# Patient Record
Sex: Male | Born: 1955 | Race: White | Hispanic: No | Marital: Married | Smoking: Current every day smoker
Health system: Southern US, Community
[De-identification: ages and names within clinical notes are randomized; demographics above are authoritative.]

## PROBLEM LIST (undated history)

## (undated) DIAGNOSIS — I10 Essential (primary) hypertension: Secondary | ICD-10-CM

## (undated) DIAGNOSIS — N529 Male erectile dysfunction, unspecified: Secondary | ICD-10-CM

## (undated) DIAGNOSIS — L209 Atopic dermatitis, unspecified: Secondary | ICD-10-CM

## (undated) DIAGNOSIS — S6292XA Unspecified fracture of left wrist and hand, initial encounter for closed fracture: Secondary | ICD-10-CM

## (undated) HISTORY — DX: Unspecified fracture of left hand, initial encounter for closed fracture: S62.92XA

## (undated) HISTORY — DX: Atopic dermatitis, unspecified: L20.9

## (undated) HISTORY — DX: Male erectile dysfunction, unspecified: N52.9

## (undated) HISTORY — DX: Essential (primary) hypertension: I10

---

## 2004-10-24 ENCOUNTER — Ambulatory Visit: Payer: Self-pay | Admitting: Family Medicine

## 2005-03-20 ENCOUNTER — Ambulatory Visit: Payer: Self-pay | Admitting: Family Medicine

## 2005-04-10 ENCOUNTER — Ambulatory Visit: Payer: Self-pay | Admitting: Family Medicine

## 2005-05-01 ENCOUNTER — Ambulatory Visit: Payer: Self-pay | Admitting: Family Medicine

## 2005-06-17 ENCOUNTER — Ambulatory Visit: Payer: Self-pay | Admitting: Family Medicine

## 2005-10-30 ENCOUNTER — Ambulatory Visit: Payer: Self-pay | Admitting: Family Medicine

## 2006-04-30 ENCOUNTER — Ambulatory Visit: Payer: Self-pay | Admitting: Family Medicine

## 2006-10-29 ENCOUNTER — Ambulatory Visit: Payer: Self-pay | Admitting: Family Medicine

## 2007-06-24 ENCOUNTER — Ambulatory Visit: Payer: Self-pay | Admitting: Family Medicine

## 2007-06-24 LAB — CONVERTED CEMR LAB
Glucose, Urine, Semiquant: NEGATIVE
Specific Gravity, Urine: 1.015
Urobilinogen, UA: 2

## 2007-06-27 LAB — CONVERTED CEMR LAB
ALT: 20 units/L (ref 0–53)
AST: 21 units/L (ref 0–37)
Albumin: 3.5 g/dL (ref 3.5–5.2)
BUN: 6 mg/dL (ref 6–23)
Basophils Relative: 0.7 % (ref 0.0–1.0)
Bilirubin, Direct: 0.1 mg/dL (ref 0.0–0.3)
CO2: 33 meq/L — ABNORMAL HIGH (ref 19–32)
Calcium: 9.1 mg/dL (ref 8.4–10.5)
Cholesterol: 138 mg/dL (ref 0–200)
Eosinophils Relative: 2.1 % (ref 0.0–5.0)
GFR calc Af Amer: 102 mL/min
Glucose, Bld: 98 mg/dL (ref 70–99)
HCT: 41.6 % (ref 39.0–52.0)
Hemoglobin: 14.4 g/dL (ref 13.0–17.0)
MCHC: 34.6 g/dL (ref 30.0–36.0)
MCV: 87.2 fL (ref 78.0–100.0)
Monocytes Absolute: 0.6 10*3/uL (ref 0.2–0.7)
Monocytes Relative: 9.6 % (ref 3.0–11.0)
Neutro Abs: 2.7 10*3/uL (ref 1.4–7.7)
Platelets: 230 10*3/uL (ref 150–400)
Potassium: 3.8 meq/L (ref 3.5–5.1)
TSH: 1.46 microintl units/mL (ref 0.35–5.50)
Total Bilirubin: 0.9 mg/dL (ref 0.3–1.2)
Total CHOL/HDL Ratio: 4.3
Total Protein: 5.9 g/dL — ABNORMAL LOW (ref 6.0–8.3)
VLDL: 10 mg/dL (ref 0–40)

## 2007-07-01 ENCOUNTER — Ambulatory Visit: Payer: Self-pay | Admitting: Family Medicine

## 2007-07-01 DIAGNOSIS — I10 Essential (primary) hypertension: Secondary | ICD-10-CM | POA: Insufficient documentation

## 2007-08-02 ENCOUNTER — Ambulatory Visit: Payer: Self-pay | Admitting: Family Medicine

## 2007-09-07 ENCOUNTER — Inpatient Hospital Stay (HOSPITAL_COMMUNITY): Admission: EM | Admit: 2007-09-07 | Discharge: 2007-09-08 | Payer: Self-pay | Admitting: Emergency Medicine

## 2007-09-07 ENCOUNTER — Encounter: Payer: Self-pay | Admitting: Family Medicine

## 2007-09-08 ENCOUNTER — Encounter: Payer: Self-pay | Admitting: Family Medicine

## 2007-09-12 ENCOUNTER — Ambulatory Visit: Payer: Self-pay | Admitting: Family Medicine

## 2007-10-03 ENCOUNTER — Ambulatory Visit: Payer: Self-pay | Admitting: Family Medicine

## 2007-10-04 LAB — CONVERTED CEMR LAB
Chloride: 98 meq/L (ref 96–112)
Sodium: 134 meq/L — ABNORMAL LOW (ref 135–145)

## 2007-12-30 ENCOUNTER — Ambulatory Visit: Payer: Self-pay | Admitting: Family Medicine

## 2008-01-27 ENCOUNTER — Ambulatory Visit: Payer: Self-pay | Admitting: Family Medicine

## 2008-06-29 ENCOUNTER — Ambulatory Visit: Payer: Self-pay | Admitting: Family Medicine

## 2009-01-04 ENCOUNTER — Ambulatory Visit: Payer: Self-pay | Admitting: Family Medicine

## 2009-01-04 LAB — CONVERTED CEMR LAB
Blood in Urine, dipstick: NEGATIVE
Nitrite: NEGATIVE
Urobilinogen, UA: 2
WBC Urine, dipstick: NEGATIVE
pH: 7.5

## 2009-01-08 LAB — CONVERTED CEMR LAB
AST: 15 units/L (ref 0–37)
Basophils Relative: 0.2 % (ref 0.0–3.0)
Creatinine, Ser: 0.9 mg/dL (ref 0.4–1.5)
Eosinophils Relative: 2.5 % (ref 0.0–5.0)
HCT: 42.5 % (ref 39.0–52.0)
LDL Cholesterol: 87 mg/dL (ref 0–99)
Lymphocytes Relative: 22.8 % (ref 12.0–46.0)
MCHC: 34.1 g/dL (ref 30.0–36.0)
MCV: 98 fL (ref 78.0–100.0)
Monocytes Relative: 6.5 % (ref 3.0–12.0)
Neutrophils Relative %: 68 % (ref 43.0–77.0)
PSA: 0.32 ng/mL (ref 0.10–4.00)
Platelets: 240 10*3/uL (ref 150–400)
Potassium: 4.5 meq/L (ref 3.5–5.1)
Sodium: 133 meq/L — ABNORMAL LOW (ref 135–145)
Total CHOL/HDL Ratio: 4
Total Protein: 6.1 g/dL (ref 6.0–8.3)
WBC: 6.4 10*3/uL (ref 4.5–10.5)

## 2009-01-11 ENCOUNTER — Ambulatory Visit: Payer: Self-pay | Admitting: Family Medicine

## 2009-01-21 ENCOUNTER — Ambulatory Visit: Payer: Self-pay | Admitting: Gastroenterology

## 2009-02-25 ENCOUNTER — Ambulatory Visit: Payer: Self-pay | Admitting: Gastroenterology

## 2009-02-25 ENCOUNTER — Encounter: Payer: Self-pay | Admitting: Gastroenterology

## 2009-02-25 LAB — HM COLONOSCOPY

## 2009-02-27 ENCOUNTER — Encounter: Payer: Self-pay | Admitting: Gastroenterology

## 2009-04-24 ENCOUNTER — Telehealth: Payer: Self-pay | Admitting: Family Medicine

## 2009-04-26 ENCOUNTER — Telehealth: Payer: Self-pay | Admitting: Family Medicine

## 2009-07-08 ENCOUNTER — Telehealth: Payer: Self-pay | Admitting: Family Medicine

## 2009-07-09 ENCOUNTER — Ambulatory Visit: Payer: Self-pay | Admitting: Family Medicine

## 2009-07-12 ENCOUNTER — Ambulatory Visit: Payer: Self-pay | Admitting: Family Medicine

## 2010-01-03 ENCOUNTER — Ambulatory Visit: Payer: Self-pay | Admitting: Family Medicine

## 2010-01-03 DIAGNOSIS — N138 Other obstructive and reflux uropathy: Secondary | ICD-10-CM | POA: Insufficient documentation

## 2010-01-03 DIAGNOSIS — N401 Enlarged prostate with lower urinary tract symptoms: Secondary | ICD-10-CM | POA: Insufficient documentation

## 2010-01-07 LAB — CONVERTED CEMR LAB
AST: 22 units/L (ref 0–37)
BUN: 5 mg/dL — ABNORMAL LOW (ref 6–23)
Basophils Absolute: 0 10*3/uL (ref 0.0–0.1)
CO2: 30 meq/L (ref 19–32)
Chloride: 101 meq/L (ref 96–112)
Eosinophils Absolute: 0.3 10*3/uL (ref 0.0–0.7)
Glucose, Bld: 58 mg/dL — ABNORMAL LOW (ref 70–99)
HCT: 41.8 % (ref 39.0–52.0)
LDL Cholesterol: 88 mg/dL (ref 0–99)
Lymphocytes Relative: 30 % (ref 12.0–46.0)
Lymphs Abs: 1.7 10*3/uL (ref 0.7–4.0)
MCHC: 33.6 g/dL (ref 30.0–36.0)
MCV: 100.7 fL — ABNORMAL HIGH (ref 78.0–100.0)
Neutro Abs: 3.3 10*3/uL (ref 1.4–7.7)
Neutrophils Relative %: 57.3 % (ref 43.0–77.0)
Potassium: 5.8 meq/L — ABNORMAL HIGH (ref 3.5–5.1)
Sodium: 135 meq/L (ref 135–145)
TSH: 0.87 microintl units/mL (ref 0.35–5.50)
Triglycerides: 47 mg/dL (ref 0.0–149.0)
WBC: 5.8 10*3/uL (ref 4.5–10.5)

## 2010-01-09 ENCOUNTER — Ambulatory Visit: Payer: Self-pay | Admitting: Family Medicine

## 2010-01-10 LAB — CONVERTED CEMR LAB
Chloride: 101 meq/L (ref 96–112)
GFR calc non Af Amer: 94.01 mL/min (ref >60)
Glucose, Bld: 85 mg/dL (ref 70–99)
Potassium: 3.5 meq/L (ref 3.5–5.1)
Sodium: 134 meq/L — ABNORMAL LOW (ref 135–145)

## 2010-06-30 ENCOUNTER — Ambulatory Visit: Payer: Self-pay | Admitting: Family Medicine

## 2010-07-11 ENCOUNTER — Ambulatory Visit: Payer: Self-pay | Admitting: Family Medicine

## 2010-12-20 ENCOUNTER — Encounter: Payer: Self-pay | Admitting: Family Medicine

## 2010-12-21 LAB — CONVERTED CEMR LAB
BUN: 9 mg/dL (ref 6–23)
Chloride: 95 meq/L — ABNORMAL LOW (ref 96–112)
Creatinine, Ser: 0.9 mg/dL (ref 0.4–1.5)
Glucose, Bld: 94 mg/dL (ref 70–99)
Potassium: 5 meq/L (ref 3.5–5.1)
Sodium: 133 meq/L — ABNORMAL LOW (ref 135–145)

## 2010-12-23 NOTE — Medication Information (Signed)
Summary: Authorization for Viagra  Authorization for Viagra   Imported By: Maryln Gottron 01/08/2010 15:40:00  _____________________________________________________________________  External Attachment:    Type:   Image     Comment:   External Document

## 2010-12-23 NOTE — Assessment & Plan Note (Signed)
Summary: 6 MONTH ROV//[facility]   Vital Signs:  Patient profile:   55 year old male Weight:      153 pounds BMI:     23.69 O2 Sat:      93 % on Room air Temp:     98 degrees F Pulse rate:   64 / minute BP sitting:   140 / 90  (left arm) Cuff size:   large  Vitals Entered By: Pura Spice, RN (January 03, 2010 8:37 AM)  O2 Flow:  Room air CC: 6 month follow up for bp. smokes ppd. Is Patient Diabetic? No   History of Present Illness: here to recheck BP. As usual it is stable at home but jumps up when he comes here. He feels fine.   Preventive Screening-Counseling & Management  Alcohol-Tobacco     Smoking Status: current     Packs/Day: 1.0     Year Started: 1974  Allergies: 1)  ! Erythromycin  Past History:  Past Medical History: Reviewed history from 01/11/2009 and no changes required. Hypertension ED concussion 10-08  Social History: Packs/Day:  1.0  Review of Systems  The patient denies anorexia, fever, weight loss, weight gain, vision loss, decreased hearing, hoarseness, chest pain, syncope, dyspnea on exertion, peripheral edema, prolonged cough, headaches, hemoptysis, abdominal pain, melena, hematochezia, severe indigestion/heartburn, hematuria, incontinence, genital sores, muscle weakness, suspicious skin lesions, transient blindness, difficulty walking, depression, unusual weight change, abnormal bleeding, enlarged lymph nodes, angioedema, breast masses, and testicular masses.    Physical Exam  General:  Well-developed,well-nourished,in no acute distress; alert,appropriate and cooperative throughout examination Neck:  No deformities, masses, or tenderness noted. Lungs:  Normal respiratory effort, chest expands symmetrically. Lungs are clear to auscultation, no crackles or wheezes. Heart:  Normal rate and regular rhythm. S1 and S2 normal without gallop, murmur, click, rub or other extra sounds.   Impression & Recommendations:  Problem # 1:  HYPERTENSION  (ICD-401.9)  His updated medication list for this problem includes:    Toprol Xl 200 Mg Tb24 (Metoprolol succinate) .Marland Kitchen... 1 by mouth once daily    Norvasc 10 Mg Tabs (Amlodipine besylate) .Marland Kitchen... 1 by mouth once daily    Cozaar 100 Mg Tabs (Losartan potassium) .Marland Kitchen... 1 by mouth once daily    Clonidine Hcl 0.2 Mg Tabs (Clonidine hcl) .Marland Kitchen..Marland Kitchen Two times a day  Orders: UA Dipstick w/o Micro (automated)  (81003) Venipuncture (47425) TLB-Lipid Panel (80061-LIPID) TLB-BMP (Basic Metabolic Panel-BMET) (80048-METABOL) TLB-CBC Platelet - w/Differential (85025-CBCD) TLB-Hepatic/Liver Function Pnl (80076-HEPATIC) TLB-TSH (Thyroid Stimulating Hormone) (84443-TSH)  Problem # 2:  BENIGN PROSTATIC HYPERTROPHY, WITH OBSTRUCTION (ICD-600.01)  Orders: TLB-PSA (Prostate Specific Antigen) (84153-PSA)  Complete Medication List: 1)  Toprol Xl 200 Mg Tb24 (Metoprolol succinate) .Marland Kitchen.. 1 by mouth once daily 2)  Norvasc 10 Mg Tabs (Amlodipine besylate) .Marland Kitchen.. 1 by mouth once daily 3)  Viagra 100 Mg Tabs (Sildenafil citrate) .... As needed 4)  Cozaar 100 Mg Tabs (Losartan potassium) .Marland Kitchen.. 1 by mouth once daily 5)  Clonidine Hcl 0.2 Mg Tabs (Clonidine hcl) .... Two times a day  Patient Instructions: 1)  Get labs today Prescriptions: CLONIDINE HCL 0.2 MG TABS (CLONIDINE HCL) two times a day  #60 x 11   Entered and Authorized by:   Nelwyn Salisbury MD   Signed by:   Nelwyn Salisbury MD on 01/03/2010   Method used:   Electronically to        [facility] (retail)       [address]  [address] [place], [place]  [address]       Ph: [phone]       Fax: [phone]   RxID:   [id number] COZAAR 100 MG TABS (LOSARTAN POTASSIUM) 1 by mouth once daily  #30 x 11   Entered and Authorized by:   Nelwyn Salisbury MD   Signed by:   Nelwyn Salisbury MD on 01/03/2010   Method used:   Electronically to        [facility] (retail)       [address]       [place], [place]  [address]       Ph: [phone]       Fax: [phone]   RxID:   [id number] VIAGRA 100 MG TABS (SILDENAFIL CITRATE) as needed  #10 x 11   Entered and Authorized by:   Nelwyn Salisbury MD   Signed by:   Nelwyn Salisbury MD on 01/03/2010   Method used:   Electronically to        [facility] (retail)       [address]       [place], [place]  [address]       Ph: [phone]       Fax: [phone]   RxID:   [id number] NORVASC 10 MG TABS (AMLODIPINE BESYLATE) 1 by mouth once daily  #30 x 11   Entered and Authorized by:   Nelwyn Salisbury MD   Signed by:   Nelwyn Salisbury MD on 01/03/2010   Method used:   Electronically to        [facility] (retail)       [address]       [place], [place]  [address]       Ph: [phone]       Fax: [phone]   RxID:   [id number] TOPROL XL 200 MG TB24 (METOPROLOL SUCCINATE) 1 by mouth once daily  #30 x 11   Entered and Authorized by:   Nelwyn Salisbury MD   Signed by:   Nelwyn Salisbury MD on 01/03/2010   Method used:   Electronically to        [facility] (retail)       [address]       [place], [place]  [address]       Ph: [phone]       Fax: [phone]   RxID:   [id number]   Appended Document: 6 MONTH ROV//[facility]  Laboratory Results   Urine Tests    Routine Urinalysis   Color: yellow Appearance: Clear Glucose: negative   (Normal Range: Negative) Bilirubin: negative   (Normal Range: Negative) Ketone: negative   (Normal Range: Negative) Spec. Gravity: 1.015   (Normal Range: 1.003-1.035) Blood: negative   (Normal Range: Negative) pH: 7.0   (Normal Range: 5.0-8.0) Protein: negative   (Normal Range: Negative) Urobilinogen: 0.2   (Normal Range: 0-1) Nitrite: negative   (Normal Range: Negative) Leukocyte Esterace: negative   (Normal Range: Negative)    Comments: Rita Ohara  January 03, 2010 5:01  PM

## 2010-12-23 NOTE — Medication Information (Signed)
Summary: Authorization for Viagra/[facility]  Authorization for Viagra/[facility]   Imported By: Maryln Gottron 01/07/2010 15:56:54  _____________________________________________________________________  External Attachment:    Type:   Image     Comment:   External Document

## 2010-12-23 NOTE — Assessment & Plan Note (Signed)
Summary: HAND PAIN // [facility]   Vital Signs:  Patient profile:   55 year old male Weight:      151 pounds BMI:     23.39 BP sitting:   140 / 86  (right arm) Cuff size:   regular  Vitals Entered By: Raechel Ache, RN (June 30, 2010 9:32 AM) CC: Slammed L hand in door Friday- swollen, painful, bruised.   History of Present Illness: On 06-27-10 while working on his job he was pulling down a sliding door on the back of his truck, when his left hand was caught under the door. he immediately had a lot of pain and swelling, so he went home. He has been icing the hand and taking Advil since then. The hand still throbs and he is unable to drive or load today.   Allergies: 1)  ! Erythromycin  Past History:  Past Medical History: Reviewed history from 01/11/2009 and no changes required. Hypertension ED concussion 10-08  Past Surgical History: Reviewed history from 07/09/2009 and no changes required. Denies surgical history colonoscopy 02-25-09 per Dr. Jarold Motto, showed multiple polyps and severe diverticulosis, repeat in 5 yrs  Review of Systems  The patient denies anorexia, fever, weight loss, weight gain, vision loss, decreased hearing, hoarseness, chest pain, syncope, dyspnea on exertion, peripheral edema, prolonged cough, headaches, hemoptysis, abdominal pain, melena, hematochezia, severe indigestion/heartburn, hematuria, incontinence, genital sores, muscle weakness, suspicious skin lesions, transient blindness, difficulty walking, depression, unusual weight change, abnormal bleeding, enlarged lymph nodes, angioedema, breast masses, and testicular masses.    Physical Exam  General:  Well-developed,well-nourished,in no acute distress; alert,appropriate and cooperative throughout examination Extremities:  the left hand is ecchymotic and swollen. He is tender over the 2nd and 3rd metacarpals. No crepitus. His fingers have full EOM and are intact neurovascularly   Impression &  Recommendations:  Problem # 1:  CONTUSION OF HAND (ICD-923.20)  Complete Medication List: 1)  Toprol Xl 200 Mg Tb24 (Metoprolol succinate) .Marland Kitchen.. 1 by mouth once daily 2)  Norvasc 10 Mg Tabs (Amlodipine besylate) .Marland Kitchen.. 1 by mouth once daily 3)  Viagra 100 Mg Tabs (Sildenafil citrate) .... As needed 4)  Cozaar 100 Mg Tabs (Losartan potassium) .Marland Kitchen.. 1 by mouth once daily 5)  Clonidine Hcl 0.2 Mg Tabs (Clonidine hcl) .... Two times a day  Other Orders: T-Hand Left 3 Views (73130TC)  Patient Instructions: 1)  We will send him for Xrays of the hand now

## 2010-12-23 NOTE — Assessment & Plan Note (Signed)
Summary: cpx/pt will come in fasting/njr   Vital Signs:  Patient profile:   55 year old male Weight:      149 pounds BMI:     23.08 BP sitting:   150 / 98  (left arm) Cuff size:   regular  Vitals Entered By: Raechel Ache, RN (July 11, 2010 9:18 AM) CC: CPX, fasting but had labs done in Feb.    History of Present Illness: 55 yr old male for a cpx. He feels fine other than the left hand. He fractured 3 metacarpals in the left hand 2 weeks ago. Saw Dr. Gilman Schmidt and had a cast applied. His BP is steady at home in the range of 120-130 over 70-80.   Allergies: 1)  ! Erythromycin  Past History:  Past Medical History: Hypertension ED concussion 10-08 right hand fractures 06-2010  Past Surgical History: Reviewed history from 07/09/2009 and no changes required. Denies surgical history colonoscopy 02-25-09 per Dr. Jarold Motto, showed multiple polyps and severe diverticulosis, repeat in 5 yrs  Family History: Reviewed history from 07/01/2007 and no changes required. Family History of Arthritis Family History of CAD Male 1st degree relative <60 Family History Hypertension  Social History: Reviewed history from 01/11/2009 and no changes required. Occupation: truck Hospital doctor Married Current Smoker Alcohol use-no  Review of Systems  The patient denies anorexia, fever, weight loss, weight gain, vision loss, decreased hearing, hoarseness, chest pain, syncope, dyspnea on exertion, peripheral edema, prolonged cough, headaches, hemoptysis, abdominal pain, melena, hematochezia, severe indigestion/heartburn, hematuria, incontinence, genital sores, muscle weakness, suspicious skin lesions, transient blindness, difficulty walking, depression, unusual weight change, abnormal bleeding, enlarged lymph nodes, angioedema, breast masses, and testicular masses.    Physical Exam  General:  Well-developed,well-nourished,in no acute distress; alert,appropriate and cooperative throughout  examination Head:  Normocephalic and atraumatic without obvious abnormalities. No apparent alopecia or balding. Eyes:  No corneal or conjunctival inflammation noted. EOMI. Perrla. Funduscopic exam benign, without hemorrhages, exudates or papilledema. Vision grossly normal. Ears:  External ear exam shows no significant lesions or deformities.  Otoscopic examination reveals clear canals, tympanic membranes are intact bilaterally without bulging, retraction, inflammation or discharge. Hearing is grossly normal bilaterally. Nose:  External nasal examination shows no deformity or inflammation. Nasal mucosa are pink and moist without lesions or exudates. Mouth:  Oral mucosa and oropharynx without lesions or exudates.  Teeth in good repair. Neck:  No deformities, masses, or tenderness noted. Chest Wall:  No deformities, masses, tenderness or gynecomastia noted. Lungs:  Normal respiratory effort, chest expands symmetrically. Lungs are clear to auscultation, no crackles or wheezes. Heart:  Normal rate and regular rhythm. S1 and S2 normal without gallop, murmur, click, rub or other extra sounds. EKG normal Abdomen:  Bowel sounds positive,abdomen soft and non-tender without masses, organomegaly or hernias noted. Rectal:  No external abnormalities noted. Normal sphincter tone. No rectal masses or tenderness. Heme neg Genitalia:  Testes bilaterally descended without nodularity, tenderness or masses. No scrotal masses or lesions. No penis lesions or urethral discharge. Prostate:  Prostate gland firm and smooth, no enlargement, nodularity, tenderness, mass, asymmetry or induration. Msk:  No deformity or scoliosis noted of thoracic or lumbar spine.   Pulses:  R and L carotid,radial,femoral,dorsalis pedis and posterior tibial pulses are full and equal bilaterally Extremities:  normal except for the left hand Neurologic:  No cranial nerve deficits noted. Station and gait are normal. Plantar reflexes are down-going  bilaterally. DTRs are symmetrical throughout. Sensory, motor and coordinative functions appear intact. Skin:  Intact without suspicious lesions or rashes Cervical Nodes:  No lymphadenopathy noted Axillary Nodes:  No palpable lymphadenopathy Inguinal Nodes:  No significant adenopathy Psych:  Cognition and judgment appear intact. Alert and cooperative with normal attention span and concentration. No apparent delusions, illusions, hallucinations   Impression & Recommendations:  Problem # 1:  PREVENTIVE HEALTH CARE (ICD-V70.0)  Orders: Venipuncture (13086) TLB-BMP (Basic Metabolic Panel-BMET) (80048-METABOL) Hemoccult Guaiac-1 spec.(in office) (82270) EKG w/ Interpretation (93000)  Complete Medication List: 1)  Toprol Xl 200 Mg Tb24 (Metoprolol succinate) .Marland Kitchen.. 1 by mouth once daily 2)  Norvasc 10 Mg Tabs (Amlodipine besylate) .Marland Kitchen.. 1 by mouth once daily 3)  Viagra 100 Mg Tabs (Sildenafil citrate) .... As needed 4)  Cozaar 100 Mg Tabs (Losartan potassium) .Marland Kitchen.. 1 by mouth once daily 5)  Clonidine Hcl 0.2 Mg Tabs (Clonidine hcl) .... Two times a day  Patient Instructions: 1)  get labs  Appended Document: Orders Update    Clinical Lists Changes  Orders: Added new Service order of Specimen Handling (57846) - Signed

## 2011-01-09 ENCOUNTER — Ambulatory Visit (INDEPENDENT_AMBULATORY_CARE_PROVIDER_SITE_OTHER): Payer: Managed Care, Other (non HMO) | Admitting: Family Medicine

## 2011-01-09 ENCOUNTER — Encounter: Payer: Self-pay | Admitting: Family Medicine

## 2011-01-09 VITALS — BP 140/80 | HR 57 | Temp 98.1°F | Wt 154.0 lb

## 2011-01-09 DIAGNOSIS — I10 Essential (primary) hypertension: Secondary | ICD-10-CM

## 2011-01-09 MED ORDER — LOSARTAN POTASSIUM 100 MG PO TABS: 100.0000 mg | ORAL_TABLET | Freq: Every day | ORAL | Status: DC

## 2011-01-09 MED ORDER — CLONIDINE HCL 0.2 MG PO TABS: 0.2000 mg | ORAL_TABLET | Freq: Two times a day (BID) | ORAL | Status: DC

## 2011-01-09 MED ORDER — AMLODIPINE BESYLATE 10 MG PO TABS: 10.0000 mg | ORAL_TABLET | Freq: Every day | ORAL | Status: DC

## 2011-01-09 MED ORDER — METOPROLOL SUCCINATE ER 200 MG PO TB24: 200.0000 mg | ORAL_TABLET | Freq: Every day | ORAL | Status: DC

## 2011-01-09 NOTE — Progress Notes (Signed)
  Subjective:    Patient ID: [name], male    DOB: 1956/06/04, 55 y.o.   MRN: [id number]  HPI Here to follow up on HTN. He feels great, and his BP at home is stable.    Review of Systems  Constitutional: Negative.   Respiratory: Negative.   Cardiovascular: Negative.        Objective:   Physical Exam  Constitutional: He appears well-developed and well-nourished.  Cardiovascular: Normal rate, regular rhythm, normal heart sounds and intact distal pulses.  Exam reveals no gallop and no friction rub.   No murmur heard. Pulmonary/Chest: Effort normal and breath sounds normal. No respiratory distress. He has no wheezes. He has no rales. He exhibits no tenderness.          Assessment & Plan:  Doing well. Get his cpx in August

## 2011-04-07 NOTE — H&P (Signed)
[name], [name]            ACCOUNT NO.:  [id number]   MEDICAL RECORD NO.:  [id number]          PATIENT TYPE:  INP   LOCATION:  2928                         FACILITY:  [facility]   PHYSICIAN:  Gabrielle Dare. Janee Morn, M.D.DATE OF BIRTH:  04-29-1956   DATE OF ADMISSION:  09/06/2007  DATE OF DISCHARGE:                              HISTORY & PHYSICAL   CHIEF COMPLAINT:  Head pain and slurred speech after fall.   HISTORY OF PRESENT ILLNESS:  The patient is a 55 year old white male who  fell from the back of his semi-trailer truck.  He had no loss of  consciousness at the scene.  He had some mild headache.  He was  evaluated at the [facility] emergency department.  Work up here showed  likely intracerebral contusions and the patient does have some slurred  speech and we are asked to admit to the trauma service.   PAST MEDICAL HISTORY:  Hypertension.   PAST SURGICAL HISTORY:  None.   SOCIAL HISTORY:  He smokes cigarettes.  He occasionally drinks alcohol.  He works as a Naval architect.   ALLERGIES:  ERYTHROMYCIN.   MEDICATIONS:  1. Diovan/HCTZ 320/12.5 mg p.o. daily.  2. Norvasc 10 mg p.o. daily.  3. Toprol XL 200 mg p.o. daily.   REVIEW OF SYSTEMS:  Significant for the above complaints.  In addition  on the musculoskeletal system he has an abrasion on his left shoulder.   PHYSICAL EXAMINATION:  VITAL SIGNS:  Temperature  97.7, pulse 62,  respirations 16, blood pressure 117/79, saturation 98%.  HEENT:  There is a small hematoma on his left scalp.  Eyes:  Pupils are  equal and reactive.  Sclerae are clear.  Ears are clear bilaterally.  Face is atraumatic.  NECK:  Supple.  No tenderness posteriorly and no step offs.  PULMONARY:  Lungs are clear to auscultation.  CARDIOVASCULAR:  Heart is regular.  No murmurs are heard.  Distal pulses  are 2+.  ABDOMEN:  Soft and nontender.  Bowel sounds are normal.  No organomegaly  is noted.  Pelvis is stable anteriorly.  EXTREMITIES:  He does have  some abrasions on this left posterior  shoulder.  MUSCULOSKELETAL:  He has no gross deformities and no tenderness.  BACK:  There is no midline step off or tenderness.  He does have the  abrasions as noted above.  NEUROLOGIC:  Glasgow Coma Scale is 14. Eyes 4, verbal 4, motor 6.  He  does have some slurred speech but he does move all extremities and  follows commands.  Strength is equal.   LABORATORY STUDIES:  Pending.  CT scan of the head shows questionable  right frontal and temporal intracerebral contusions.  CT scan of the  cervical spine shows degenerative changes but no acute findings.   IMPRESSION:  A 55 year old white male status post fall.  1. 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  2. Left shoulder abrasion.  3. Hypertension.   PLAN:  Admit him to the trauma service and the step-down unit.  We will  get  a followup head CT in the morning and have a nonurgent neurosurgery  consultation.      Gabrielle Dare Janee Morn, M.D.  Electronically Signed     BET/MEDQ  D:  09/06/2007  T:  09/07/2007  Job:  [id number]

## 2011-04-07 NOTE — Discharge Summary (Signed)
NAME:  [name], [name]            ACCOUNT NO.:  [id number]   MEDICAL RECORD NO.:  [id number]          PATIENT TYPE:  INP   LOCATION:  3041                         FACILITY:  [facility]   PHYSICIAN:  Adam Dare. Janee [name], Adam ToddDATE OF BIRTH:  08/27/1956   DATE OF ADMISSION:  09/06/2007  DATE OF DISCHARGE:  09/08/2007                               DISCHARGE SUMMARY   DISCHARGE DIAGNOSES:  1. Fall.  2. Traumatic brain injury with intracerebral contusion.  3. Hyponatremia.  4. Hypertension.  5. Abrasions over the left scalp and shoulder area.   CONSULTANTS:  Dr. Yetta Barre from neurosurgery.   PROCEDURE:  None.   HISTORY OF PRESENT ILLNESS:  This is a 55 year old white male who fell  off the back of a tractor trailer truck striking his left shoulder and  head.  He came in for evaluation as a non-trauma code after having some  slurred speech and a mild headache.  He had 2 small intracerebral  contusions and was admitted for observation.  Interestingly enough on  presentation, he was found to have  significant hyponatremia with a  level at 125.   HOSPITAL COURSE:  The patient did well in the hospital. The slurred  speech cleared up within 12 hours.  He was essentially asymptomatic from  both the head injury and hyponatremic standpoint during his hospital  stay.  Follow up CT scan did not show any worsening of the brain injury.  His sodium climbed to around 127-128 for his final 2 days in the  hospital.  He remained asymptomatic and was able be discharged home in  good condition in the care of his wife.   DISCHARGE MEDICATIONS:  1. Toprol.  2. Norvasc to use as directed.  3. Diovan/hydrochlorothiazide.  He is to hold this as it is      potentially the source of his hyponatremia until he discusses it      with his primary care physician.  He notes he is having no pain, so      did not need to go home with a prescription for any pain medicine.   FOLLOW UP:  1. The patient will follow up with  Dr. Yetta Barre in his office.  2. His primary care [name], [name] for evaluation of his      hyponatremia.   DISCHARGE INSTRUCTIONS:  1. If he has questions or concerns, he may call the trauma service.  2. Otherwise, follow up with [place] will be on as needed basis.      [name], P.A.      [name], M.D.  Electronically Signed    MJ/MEDQ  D:  09/08/2007  T:  09/09/2007  Job:  [id number]   cc:   Jeannett Senior A. Clent Ridges, MD  Tia Alert, MD

## 2011-04-07 NOTE — Consult Note (Signed)
NAMEQUINTEN, [name]            ACCOUNT NO.:  [id number]   MEDICAL RECORD NO.:  [id number]          PATIENT TYPE:  INP   LOCATION:  2928                         FACILITY:  [facility]   PHYSICIAN:  Tia Alert, MD     DATE OF BIRTH:  [birth year]   DATE OF CONSULTATION:  09/07/2007  DATE OF DISCHARGE:                                 CONSULTATION   CHIEF COMPLAINT:  Closed head injury.   HISTORY OF PRESENT ILLNESS:  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  He landed on his back.  He denies any neck pain.  He  denies and loss of consciousness.  He did have some mild headache.  He  had an immediate onset of slurred speech.  He presented to the emergency  department where a head CT showed two small hyperdense foci, one in the  right frontal lobe, one in the right temporal lobe.  Question was raised  rather this was contusion or not.  He was admitted to a stepdown unit  for observation with plans to have followup head CT today.  At this  point, he only complains of very mild headache, had a significant  clearing of his speech, though it has not completely cleared he  believes.  He denies any numbness, tingling or weakness, or any seizure  activity.  He has had no nausea and vomiting, no double vision and no  neck pain.   PAST MEDICAL HISTORY:  Hypertension.   MEDICATIONS:  Diovan, Norvasc and Toprol XL.   ALLERGIES:  ERYTHROMYCIN.   SOCIAL HISTORY:  He works as a Naval [employer], uses occasional social  alcohol and he is a smoker.   PHYSICAL EXAMINATION:  VITAL SIGNS:  He is afebrile, pulse of 65,  respirations 16, blood pressure 118/80.  GENERAL:  Very pleasant cooperative male, lying in a stretcher.  HEENT:  He has a small contusion to the left occipital region.  His  extraocular movements are intact.  Pupils are equal,  round and reactive.  Oropharynx is benign.  NECK:  Supple and nontender.  HEART:  Regular rate and rhythm.  EXTREMITIES:  No obvious deformities.  NEUROLOGIC EXAM:  He is awake and alert.  He is oriented x4.  He has no  aphasia.  He has good attention span.  Fund of knowledge and memory  appear to be appropriate.  There is some mild slurring of his speech,  but this is the first time I have ever heard him speak so it is  difficult for me to compare this to his usual voice.  He has no facial  asymmetry.  His tongue protrudes to midline.  He can puff out his  cheeks.  He has no pronator drift.  He seems to move all extremities  well and has grossly intact sensation.  His gait is not tested.   IMAGING STUDIES:  CT scan of the brain I have reviewed  as well as  report.  It shows two tiny foci of hyperdensity in the right frontal  white matter and the right temporal white matter without significant  mass effect, edema shift.  The basal cisterns are open.  There is no  subdural or epidural hematoma or significant traumatic subarachnoid  hemorrhage.  I see no skull fracture.   CT scan of the cervical spine shows no fracture or subluxation.   ASSESSMENT/PLAN:  This is a 55 year old gentleman with a mild closed  heady injury from a fall.  Head CT shows two small hyperdense foci, one  in the right frontal lobe, one in the right temporal lobe.  It is  difficult to know if this is contusion or artifact.  He is scheduled for  a repeat CT scan today to compare and I think this is an excellent idea.  I do not know if these are going to be clinically significant at all.  Either way, I have spoken to him about his concussion and the avoidance  of any second impact to avoid the second impact syndrome.  I have  explained to him the typical symptoms of a postconcussive syndrome, and  he knows he is to avoid all strenuous activities until those symptoms  abate.  There is no need for any neurosurgical  followup at this point  unless the second head CT shows some type of significant change over the  first head CT.      Tia Alert, MD  Electronically Signed     DSJ/MEDQ  D:  09/07/2007  T:  09/07/2007  Job:  [id number]

## 2011-07-17 ENCOUNTER — Other Ambulatory Visit: Payer: Managed Care, Other (non HMO) | Admitting: Family Medicine

## 2011-07-24 ENCOUNTER — Encounter: Payer: Managed Care, Other (non HMO) | Admitting: Family Medicine

## 2011-08-07 ENCOUNTER — Other Ambulatory Visit (INDEPENDENT_AMBULATORY_CARE_PROVIDER_SITE_OTHER): Payer: Managed Care, Other (non HMO)

## 2011-08-07 DIAGNOSIS — Z0000 Encounter for general adult medical examination without abnormal findings: Secondary | ICD-10-CM

## 2011-08-07 LAB — BASIC METABOLIC PANEL
BUN: 11 mg/dL (ref 6–23)
Calcium: 8.9 mg/dL (ref 8.4–10.5)
Chloride: 96 mEq/L (ref 96–112)
Glucose, Bld: 94 mg/dL (ref 70–99)
Potassium: 5.3 mEq/L — ABNORMAL HIGH (ref 3.5–5.1)
Sodium: 133 mEq/L — ABNORMAL LOW (ref 135–145)

## 2011-08-07 LAB — HEPATIC FUNCTION PANEL
AST: 22 U/L (ref 0–37)
Albumin: 3.6 g/dL (ref 3.5–5.2)

## 2011-08-07 LAB — POCT URINALYSIS DIPSTICK
Glucose, UA: NEGATIVE
Leukocytes, UA: NEGATIVE
Urobilinogen, UA: 0.2

## 2011-08-07 LAB — CBC WITH DIFFERENTIAL/PLATELET
Basophils Absolute: 0 10*3/uL (ref 0.0–0.1)
Basophils Relative: 0.4 % (ref 0.0–3.0)
Eosinophils Absolute: 0.2 10*3/uL (ref 0.0–0.7)
Eosinophils Relative: 2.7 % (ref 0.0–5.0)
Lymphocytes Relative: 19.7 % (ref 12.0–46.0)
MCV: 101.2 fl — ABNORMAL HIGH (ref 78.0–100.0)
Neutro Abs: 5.8 10*3/uL (ref 1.4–7.7)
Neutrophils Relative %: 71 % (ref 43.0–77.0)
Platelets: 261 10*3/uL (ref 150.0–400.0)

## 2011-08-07 LAB — LIPID PANEL
Cholesterol: 147 mg/dL (ref 0–200)
HDL: 40.6 mg/dL (ref >39.00)
LDL Cholesterol: 97 mg/dL (ref 0–99)
Triglycerides: 47 mg/dL (ref 0.0–149.0)

## 2011-08-12 NOTE — Progress Notes (Signed)
Quick Note:  Left a message for pt to return call about lab results. ______

## 2011-08-12 NOTE — Progress Notes (Signed)
Quick Note:  Pt aware ______ 

## 2011-08-14 ENCOUNTER — Encounter: Payer: Self-pay | Admitting: Family Medicine

## 2011-08-14 ENCOUNTER — Ambulatory Visit (INDEPENDENT_AMBULATORY_CARE_PROVIDER_SITE_OTHER): Payer: Managed Care, Other (non HMO) | Admitting: Family Medicine

## 2011-08-14 VITALS — BP 130/84 | HR 58 | Temp 97.8°F | Ht 67.5 in | Wt 150.0 lb

## 2011-08-14 DIAGNOSIS — Z0000 Encounter for general adult medical examination without abnormal findings: Secondary | ICD-10-CM

## 2011-08-14 NOTE — Progress Notes (Signed)
  Subjective:    Patient ID: [name], male    DOB: [birth year], 55 y.o.   MRN: [id number]  HPI 55 yr old male for a cpx. He feels good and has no concerns.   Review of Systems  Constitutional: Negative.   HENT: Negative.   Eyes: Negative.   Respiratory: Negative.   Cardiovascular: Negative.   Gastrointestinal: Negative.   Genitourinary: Negative.   Musculoskeletal: Negative.   Skin: Negative.   Neurological: Negative.   Hematological: Negative.   Psychiatric/Behavioral: Negative.        Objective:   Physical Exam  Constitutional: He is oriented to person, place, and time. He appears well-developed and well-nourished. No distress.  HENT:  Head: Normocephalic and atraumatic.  Right Ear: External ear normal.  Left Ear: External ear normal.  Nose: Nose normal.  Mouth/Throat: Oropharynx is clear and moist. No oropharyngeal exudate.  Eyes: Conjunctivae and EOM are normal. Pupils are equal, round, and reactive to light. Right eye exhibits no discharge. Left eye exhibits no discharge. No scleral icterus.  Neck: Neck supple. No JVD present. No tracheal deviation present. No thyromegaly present.  Cardiovascular: Normal rate, regular rhythm, normal heart sounds and intact distal pulses.  Exam reveals no gallop and no friction rub.   No murmur heard.      EKG normal  Pulmonary/Chest: Effort normal and breath sounds normal. No respiratory distress. He has no wheezes. He has no rales. He exhibits no tenderness.  Abdominal: Soft. Bowel sounds are normal. He exhibits no distension and no mass. There is no tenderness. There is no rebound and no guarding.  Genitourinary: Rectum normal, prostate normal and penis normal. Guaiac negative stool. No penile tenderness.  Musculoskeletal: Normal range of motion. He exhibits no edema and no tenderness.  Lymphadenopathy:    He has no cervical adenopathy.  Neurological: He is alert and oriented to person, place, and time. He has normal  reflexes. No cranial nerve deficit. He exhibits normal muscle tone. Coordination normal.  Skin: Skin is warm and dry. No rash noted. He is not diaphoretic. No erythema. No pallor.  Psychiatric: He has a normal mood and affect. His behavior is normal. Judgment and thought content normal.          Assessment & Plan:  Get more exercise. Recheck in 6 months

## 2011-09-02 LAB — BASIC METABOLIC PANEL
BUN: 9
Chloride: 88 — ABNORMAL LOW
Chloride: 93 — ABNORMAL LOW
Creatinine, Ser: 1.04
GFR calc Af Amer: >60
GFR calc non Af Amer: >60
GFR calc non Af Amer: >60
Glucose, Bld: 101 — ABNORMAL HIGH
Potassium: 4.4
Sodium: 127 — ABNORMAL LOW
Sodium: 128 — ABNORMAL LOW

## 2011-09-02 LAB — CBC
HCT: 38.3 — ABNORMAL LOW
MCV: 98.5
Platelets: 285
Platelets: 314
RDW: 14.6 — ABNORMAL HIGH
RDW: 15.3 — ABNORMAL HIGH

## 2011-09-03 LAB — CBC
Hemoglobin: 14.2
MCV: 99.3
RBC: 4.13 — ABNORMAL LOW
WBC: 7.8

## 2011-09-03 LAB — DIFFERENTIAL
Basophils Absolute: 0
Eosinophils Relative: 0
Lymphocytes Relative: 14
Monocytes Relative: 7
Neutrophils Relative %: 79 — ABNORMAL HIGH

## 2011-09-03 LAB — BASIC METABOLIC PANEL
BUN: 10
CO2: 26
Calcium: 9.2
Creatinine, Ser: 1.12
GFR calc non Af Amer: >60
Sodium: 125 — ABNORMAL LOW

## 2011-09-03 LAB — PROTIME-INR: INR: 0.9

## 2011-09-03 LAB — APTT: aPTT: 25

## 2012-01-08 ENCOUNTER — Ambulatory Visit (INDEPENDENT_AMBULATORY_CARE_PROVIDER_SITE_OTHER): Payer: Managed Care, Other (non HMO) | Admitting: Family Medicine

## 2012-01-08 ENCOUNTER — Encounter: Payer: Self-pay | Admitting: Family Medicine

## 2012-01-08 VITALS — BP 124/84 | HR 67 | Temp 97.0°F | Wt 153.0 lb

## 2012-01-08 DIAGNOSIS — I10 Essential (primary) hypertension: Secondary | ICD-10-CM

## 2012-01-08 MED ORDER — AMLODIPINE BESYLATE 10 MG PO TABS: 10.0000 mg | ORAL_TABLET | Freq: Every day | ORAL | Status: DC

## 2012-01-08 MED ORDER — METOPROLOL SUCCINATE ER 200 MG PO TB24: 200.0000 mg | ORAL_TABLET | Freq: Every day | ORAL | Status: DC

## 2012-01-08 MED ORDER — LOSARTAN POTASSIUM 100 MG PO TABS: 100.0000 mg | ORAL_TABLET | Freq: Every day | ORAL | Status: DC

## 2012-01-08 MED ORDER — CLONIDINE HCL 0.2 MG PO TABS: 0.2000 mg | ORAL_TABLET | Freq: Two times a day (BID) | ORAL | Status: DC

## 2012-01-08 NOTE — Progress Notes (Signed)
  Subjective:    Patient ID: [name], male    DOB: 17-Nov-1956, 56 y.o.   MRN: [id number]  HPI Here for a 6 month follow up. He feels great and has no concerns.    Review of Systems  Constitutional: Negative.   Respiratory: Negative.   Cardiovascular: Negative.        Objective:   Physical Exam  Constitutional: He appears well-developed and well-nourished.  Neck: No thyromegaly present.  Cardiovascular: Normal rate, regular rhythm, normal heart sounds and intact distal pulses.   Pulmonary/Chest: Effort normal and breath sounds normal.  Lymphadenopathy:    He has no cervical adenopathy.          Assessment & Plan:  HTN is stable. Refilled meds

## 2012-08-12 ENCOUNTER — Other Ambulatory Visit (INDEPENDENT_AMBULATORY_CARE_PROVIDER_SITE_OTHER): Payer: Managed Care, Other (non HMO)

## 2012-08-12 DIAGNOSIS — Z0000 Encounter for general adult medical examination without abnormal findings: Secondary | ICD-10-CM

## 2012-08-12 LAB — CBC WITH DIFFERENTIAL/PLATELET
Eosinophils Relative: 2.3 % (ref 0.0–5.0)
HCT: 39.8 % (ref 39.0–52.0)
Hemoglobin: 13.5 g/dL (ref 13.0–17.0)
Lymphs Abs: 1.3 10*3/uL (ref 0.7–4.0)
MCV: 98.5 fl (ref 78.0–100.0)
Monocytes Absolute: 0.4 10*3/uL (ref 0.1–1.0)
Neutro Abs: 4.8 10*3/uL (ref 1.4–7.7)
Platelets: 242 10*3/uL (ref 150.0–400.0)
RDW: 13.9 % (ref 11.5–14.6)

## 2012-08-12 LAB — BASIC METABOLIC PANEL
GFR: 87.46 mL/min (ref >60.00)
Potassium: 4.7 mEq/L (ref 3.5–5.1)
Sodium: 129 mEq/L — ABNORMAL LOW (ref 135–145)

## 2012-08-12 LAB — LIPID PANEL
Cholesterol: 140 mg/dL (ref 0–200)
HDL: 41.2 mg/dL (ref >39.00)
Triglycerides: 38 mg/dL (ref 0.0–149.0)
VLDL: 7.6 mg/dL (ref 0.0–40.0)

## 2012-08-12 LAB — HEPATIC FUNCTION PANEL
AST: 18 U/L (ref 0–37)
Albumin: 3.7 g/dL (ref 3.5–5.2)
Alkaline Phosphatase: 69 U/L (ref 39–117)
Total Protein: 6 g/dL (ref 6.0–8.3)

## 2012-08-12 LAB — TSH: TSH: 0.88 u[IU]/mL (ref 0.35–5.50)

## 2012-08-12 LAB — PSA: PSA: 0.24 ng/mL (ref 0.10–4.00)

## 2012-08-12 LAB — POCT URINALYSIS DIPSTICK
Bilirubin, UA: NEGATIVE
Ketones, UA: NEGATIVE
Protein, UA: NEGATIVE
Spec Grav, UA: 1.015
pH, UA: 7.5

## 2012-08-15 NOTE — Progress Notes (Signed)
Quick Note:  I left voice message with normal results. ______ 

## 2012-08-19 ENCOUNTER — Encounter: Payer: Self-pay | Admitting: Family Medicine

## 2012-08-19 ENCOUNTER — Ambulatory Visit (INDEPENDENT_AMBULATORY_CARE_PROVIDER_SITE_OTHER): Payer: Managed Care, Other (non HMO) | Admitting: Family Medicine

## 2012-08-19 VITALS — BP 130/88 | HR 59 | Temp 98.0°F | Ht 66.75 in | Wt 147.0 lb

## 2012-08-19 DIAGNOSIS — Z0000 Encounter for general adult medical examination without abnormal findings: Secondary | ICD-10-CM

## 2012-08-19 NOTE — Progress Notes (Signed)
  Subjective:    Patient ID: [name], male    DOB: 05/06/1956, 56 y.o.   MRN: [id number]  HPI 56 yr old male for a cpx. He feels fine and has no concerns.    Review of Systems  Constitutional: Negative.   HENT: Negative.   Eyes: Negative.   Respiratory: Negative.   Cardiovascular: Negative.   Gastrointestinal: Negative.   Genitourinary: Negative.   Musculoskeletal: Negative.   Skin: Negative.   Neurological: Negative.   Hematological: Negative.   Psychiatric/Behavioral: Negative.        Objective:   Physical Exam  Constitutional: He is oriented to person, place, and time. He appears well-developed and well-nourished. No distress.  HENT:  Head: Normocephalic and atraumatic.  Right Ear: External ear normal.  Left Ear: External ear normal.  Nose: Nose normal.  Mouth/Throat: Oropharynx is clear and moist. No oropharyngeal exudate.  Eyes: Conjunctivae normal and EOM are normal. Pupils are equal, round, and reactive to light. Right eye exhibits no discharge. Left eye exhibits no discharge. No scleral icterus.  Neck: Neck supple. No JVD present. No tracheal deviation present. No thyromegaly present.  Cardiovascular: Normal rate, regular rhythm, normal heart sounds and intact distal pulses.  Exam reveals no gallop and no friction rub.   No murmur heard.      EKG normal   Pulmonary/Chest: Effort normal and breath sounds normal. No respiratory distress. He has no wheezes. He has no rales. He exhibits no tenderness.  Abdominal: Soft. Bowel sounds are normal. He exhibits no distension and no mass. There is no tenderness. There is no rebound and no guarding.  Genitourinary: Rectum normal, prostate normal and penis normal. Guaiac negative stool. No penile tenderness.  Musculoskeletal: Normal range of motion. He exhibits no edema and no tenderness.  Lymphadenopathy:    He has no cervical adenopathy.  Neurological: He is alert and oriented to person, place, and time. He has  normal reflexes. No cranial nerve deficit. He exhibits normal muscle tone. Coordination normal.  Skin: Skin is warm and dry. No rash noted. He is not diaphoretic. No erythema. No pallor.  Psychiatric: He has a normal mood and affect. His behavior is normal. Judgment and thought content normal.          Assessment & Plan:  Well exam.

## 2013-01-10 ENCOUNTER — Other Ambulatory Visit: Payer: Self-pay | Admitting: Family Medicine

## 2013-02-17 ENCOUNTER — Ambulatory Visit (INDEPENDENT_AMBULATORY_CARE_PROVIDER_SITE_OTHER): Payer: Managed Care, Other (non HMO) | Admitting: Family Medicine

## 2013-02-17 ENCOUNTER — Encounter: Payer: Self-pay | Admitting: Family Medicine

## 2013-02-17 VITALS — BP 150/90 | HR 59 | Temp 98.3°F | Wt 150.0 lb

## 2013-02-17 DIAGNOSIS — I10 Essential (primary) hypertension: Secondary | ICD-10-CM

## 2013-02-17 NOTE — Progress Notes (Signed)
  Subjective:    Patient ID: [name], male    DOB: 1955/12/03, 57 y.o.   MRN: [id number]  HPI Here to follow up. He feels fine. BP at home averages in the 120s over 80s.    Review of Systems  Constitutional: Negative.   Respiratory: Negative.   Cardiovascular: Negative.        Objective:   Physical Exam  Constitutional: He appears well-developed and well-nourished.  Cardiovascular: Normal rate, regular rhythm, normal heart sounds and intact distal pulses.   Pulmonary/Chest: Effort normal and breath sounds normal.          Assessment & Plan:  Doing well.

## 2013-08-18 ENCOUNTER — Other Ambulatory Visit (INDEPENDENT_AMBULATORY_CARE_PROVIDER_SITE_OTHER): Payer: Managed Care, Other (non HMO)

## 2013-08-18 DIAGNOSIS — Z0000 Encounter for general adult medical examination without abnormal findings: Secondary | ICD-10-CM

## 2013-08-18 LAB — CBC WITH DIFFERENTIAL/PLATELET
Basophils Relative: 0.5 % (ref 0.0–3.0)
Hemoglobin: 13.6 g/dL (ref 13.0–17.0)
Lymphocytes Relative: 40.2 % (ref 12.0–46.0)
Monocytes Relative: 8.3 % (ref 3.0–12.0)
Neutro Abs: 2.1 10*3/uL (ref 1.4–7.7)
RBC: 4.07 Mil/uL — ABNORMAL LOW (ref 4.22–5.81)

## 2013-08-18 LAB — BASIC METABOLIC PANEL
CO2: 29 mEq/L (ref 19–32)
Calcium: 8.9 mg/dL (ref 8.4–10.5)
GFR: 100.44 mL/min (ref >60.00)
Potassium: 5.5 mEq/L — ABNORMAL HIGH (ref 3.5–5.1)
Sodium: 129 mEq/L — ABNORMAL LOW (ref 135–145)

## 2013-08-18 LAB — HEPATIC FUNCTION PANEL
ALT: 16 U/L (ref 0–53)
AST: 19 U/L (ref 0–37)
Albumin: 3.6 g/dL (ref 3.5–5.2)
Alkaline Phosphatase: 64 U/L (ref 39–117)
Total Protein: 5.8 g/dL — ABNORMAL LOW (ref 6.0–8.3)

## 2013-08-18 LAB — LIPID PANEL
Cholesterol: 132 mg/dL (ref 0–200)
LDL Cholesterol: 84 mg/dL (ref 0–99)
Triglycerides: 58 mg/dL (ref 0.0–149.0)
VLDL: 11.6 mg/dL (ref 0.0–40.0)

## 2013-08-18 LAB — POCT URINALYSIS DIPSTICK
Ketones, UA: NEGATIVE
Protein, UA: NEGATIVE
Spec Grav, UA: 1.01
pH, UA: 7.5

## 2013-08-21 NOTE — Progress Notes (Signed)
Quick Note:  Pt has appointment on 08/25/13 will go over then. ______

## 2013-08-25 ENCOUNTER — Encounter: Payer: Self-pay | Admitting: Family Medicine

## 2013-08-25 ENCOUNTER — Ambulatory Visit (INDEPENDENT_AMBULATORY_CARE_PROVIDER_SITE_OTHER): Payer: Managed Care, Other (non HMO) | Admitting: Family Medicine

## 2013-08-25 VITALS — BP 152/90 | HR 61 | Temp 98.0°F | Ht 66.5 in | Wt 150.0 lb

## 2013-08-25 DIAGNOSIS — Z0000 Encounter for general adult medical examination without abnormal findings: Secondary | ICD-10-CM

## 2013-08-25 MED ORDER — CLONIDINE HCL 0.2 MG PO TABS: ORAL_TABLET | ORAL | Status: DC

## 2013-08-25 MED ORDER — LOSARTAN POTASSIUM 100 MG PO TABS: ORAL_TABLET | ORAL | Status: DC

## 2013-08-25 MED ORDER — AMLODIPINE BESYLATE 10 MG PO TABS: ORAL_TABLET | ORAL | Status: DC

## 2013-08-25 MED ORDER — METOPROLOL SUCCINATE ER 200 MG PO TB24: ORAL_TABLET | ORAL | Status: DC

## 2013-08-25 NOTE — Progress Notes (Signed)
  Subjective:    Patient ID: [name], male    DOB: 1956-01-25, 57 y.o.   MRN: [id number]  HPI 58 yr old male for a cpx. He feels fine. His BP is stable at home.    Review of Systems  Constitutional: Negative.   HENT: Negative.   Eyes: Negative.   Respiratory: Negative.   Cardiovascular: Negative.   Gastrointestinal: Negative.   Genitourinary: Negative.   Musculoskeletal: Negative.   Skin: Negative.   Neurological: Negative.   Psychiatric/Behavioral: Negative.        Objective:   Physical Exam  Constitutional: He is oriented to person, place, and time. He appears well-developed and well-nourished. No distress.  HENT:  Head: Normocephalic and atraumatic.  Right Ear: External ear normal.  Left Ear: External ear normal.  Nose: Nose normal.  Mouth/Throat: Oropharynx is clear and moist. No oropharyngeal exudate.  Eyes: Conjunctivae and EOM are normal. Pupils are equal, round, and reactive to light. Right eye exhibits no discharge. Left eye exhibits no discharge. No scleral icterus.  Neck: Neck supple. No JVD present. No tracheal deviation present. No thyromegaly present.  Cardiovascular: Normal rate, regular rhythm, normal heart sounds and intact distal pulses.  Exam reveals no gallop and no friction rub.   No murmur heard. EKG normal   Pulmonary/Chest: Effort normal and breath sounds normal. No respiratory distress. He has no wheezes. He has no rales. He exhibits no tenderness.  Abdominal: Soft. Bowel sounds are normal. He exhibits no distension and no mass. There is no tenderness. There is no rebound and no guarding.  Genitourinary: Rectum normal, prostate normal and penis normal. Guaiac negative stool. No penile tenderness.  Musculoskeletal: Normal range of motion. He exhibits no edema and no tenderness.  Lymphadenopathy:    He has no cervical adenopathy.  Neurological: He is alert and oriented to person, place, and time. He has normal reflexes. No cranial nerve  deficit. He exhibits normal muscle tone. Coordination normal.  Skin: Skin is warm and dry. No rash noted. He is not diaphoretic. No erythema. No pallor.  Psychiatric: He has a normal mood and affect. His behavior is normal. Judgment and thought content normal.          Assessment & Plan:  Well exam

## 2014-03-02 ENCOUNTER — Ambulatory Visit: Payer: Managed Care, Other (non HMO) | Admitting: Family Medicine

## 2014-03-05 ENCOUNTER — Telehealth: Payer: Self-pay | Admitting: Family Medicine

## 2014-03-05 ENCOUNTER — Encounter: Payer: Self-pay | Admitting: Family Medicine

## 2014-03-05 ENCOUNTER — Ambulatory Visit (INDEPENDENT_AMBULATORY_CARE_PROVIDER_SITE_OTHER): Payer: BC Managed Care – PPO | Admitting: Family Medicine

## 2014-03-05 VITALS — BP 152/82 | Temp 98.4°F | Ht 66.5 in | Wt 150.0 lb

## 2014-03-05 DIAGNOSIS — Z86010 Personal history of colonic polyps: Secondary | ICD-10-CM

## 2014-03-05 DIAGNOSIS — I10 Essential (primary) hypertension: Secondary | ICD-10-CM

## 2014-03-05 NOTE — Telephone Encounter (Signed)
Relevant patient education mailed to patient.  

## 2014-03-05 NOTE — Progress Notes (Signed)
Pre visit review using our clinic review tool, if applicable. No additional management support is needed unless otherwise documented below in the visit note. 

## 2014-03-05 NOTE — Progress Notes (Signed)
   Subjective:    Patient ID: Deveron FurlongJeffrey B Thomley, male    DOB: Apr 21, 1956, [age] y.o.   MRN: [id number]  HPI Here to recheck HTN. He feels great. His BP at home is stable in the range of 110-120 over 70-80.    Review of Systems  Constitutional: Negative.   Respiratory: Negative.   Cardiovascular: Negative.        Objective:   Physical Exam  Constitutional: He appears well-developed and well-nourished.  Cardiovascular: Normal rate, regular rhythm, normal heart sounds and intact distal pulses.   Pulmonary/Chest: Effort normal and breath sounds normal.          Assessment & Plan:  Doing well. Set up a colonoscopy.

## 2014-03-06 ENCOUNTER — Telehealth: Payer: Self-pay | Admitting: Family Medicine

## 2014-03-06 NOTE — Telephone Encounter (Signed)
Relevant patient education mailed to patient.  

## 2014-06-29 ENCOUNTER — Other Ambulatory Visit: Payer: Self-pay | Admitting: Family Medicine

## 2014-09-07 ENCOUNTER — Other Ambulatory Visit: Payer: BC Managed Care – PPO

## 2014-09-14 ENCOUNTER — Encounter: Payer: BC Managed Care – PPO | Admitting: Family Medicine

## 2014-09-21 ENCOUNTER — Other Ambulatory Visit (INDEPENDENT_AMBULATORY_CARE_PROVIDER_SITE_OTHER): Payer: BC Managed Care – PPO

## 2014-09-21 DIAGNOSIS — Z0000 Encounter for general adult medical examination without abnormal findings: Secondary | ICD-10-CM

## 2014-09-21 LAB — POCT URINALYSIS DIPSTICK
Bilirubin, UA: NEGATIVE
Blood, UA: NEGATIVE
Glucose, UA: NEGATIVE
Ketones, UA: NEGATIVE
LEUKOCYTES UA: NEGATIVE
NITRITE UA: NEGATIVE
Protein, UA: NEGATIVE
Spec Grav, UA: 1.01
UROBILINOGEN UA: 1
pH, UA: 7.5

## 2014-09-21 LAB — LIPID PANEL
Cholesterol: 145 mg/dL (ref 0–200)
HDL: 35.7 mg/dL — ABNORMAL LOW
LDL Cholesterol: 98 mg/dL (ref 0–99)
NonHDL: 109.3
Total CHOL/HDL Ratio: 4
Triglycerides: 56 mg/dL (ref 0.0–149.0)
VLDL: 11.2 mg/dL (ref 0.0–40.0)

## 2014-09-21 LAB — HEPATIC FUNCTION PANEL
ALT: 16 U/L (ref 0–53)
AST: 23 U/L (ref 0–37)
Albumin: 3.3 g/dL — ABNORMAL LOW (ref 3.5–5.2)
Alkaline Phosphatase: 69 U/L (ref 39–117)
Bilirubin, Direct: 0.1 mg/dL (ref 0.0–0.3)
Total Bilirubin: 0.7 mg/dL (ref 0.2–1.2)
Total Protein: 6.1 g/dL (ref 6.0–8.3)

## 2014-09-21 LAB — CBC WITH DIFFERENTIAL/PLATELET
Basophils Absolute: 0 K/uL (ref 0.0–0.1)
Basophils Relative: 0.3 % (ref 0.0–3.0)
Eosinophils Absolute: 0.2 K/uL (ref 0.0–0.7)
Eosinophils Relative: 3.8 % (ref 0.0–5.0)
HCT: 42.8 % (ref 39.0–52.0)
Hemoglobin: 14.3 g/dL (ref 13.0–17.0)
Lymphocytes Relative: 26.8 % (ref 12.0–46.0)
Lymphs Abs: 1.5 K/uL (ref 0.7–4.0)
MCHC: 33.3 g/dL (ref 30.0–36.0)
MCV: 99 fl (ref 78.0–100.0)
Monocytes Absolute: 0.5 K/uL (ref 0.1–1.0)
Monocytes Relative: 9.2 % (ref 3.0–12.0)
Neutro Abs: 3.3 K/uL (ref 1.4–7.7)
Neutrophils Relative %: 59.9 % (ref 43.0–77.0)
Platelets: 309 K/uL (ref 150.0–400.0)
RBC: 4.32 Mil/uL (ref 4.22–5.81)
RDW: 13.8 % (ref 11.5–15.5)
WBC: 5.6 K/uL (ref 4.0–10.5)

## 2014-09-21 LAB — BASIC METABOLIC PANEL WITH GFR
BUN: 7 mg/dL (ref 6–23)
CO2: 26 meq/L (ref 19–32)
Calcium: 8.9 mg/dL (ref 8.4–10.5)
Chloride: 94 meq/L — ABNORMAL LOW (ref 96–112)
Creatinine, Ser: 0.9 mg/dL (ref 0.4–1.5)
GFR: 96.07 mL/min
Glucose, Bld: 97 mg/dL (ref 70–99)
Potassium: 5 meq/L (ref 3.5–5.1)
Sodium: 130 meq/L — ABNORMAL LOW (ref 135–145)

## 2014-09-21 LAB — PSA: PSA: 0.35 ng/mL (ref 0.10–4.00)

## 2014-09-21 LAB — TSH: TSH: 0.94 u[IU]/mL (ref 0.35–4.50)

## 2014-09-28 ENCOUNTER — Ambulatory Visit (INDEPENDENT_AMBULATORY_CARE_PROVIDER_SITE_OTHER): Payer: BC Managed Care – PPO | Admitting: Family Medicine

## 2014-09-28 ENCOUNTER — Encounter: Payer: Self-pay | Admitting: Family Medicine

## 2014-09-28 VITALS — BP 156/92 | HR 57 | Temp 98.0°F | Ht 66.5 in | Wt 153.0 lb

## 2014-09-28 DIAGNOSIS — Z0000 Encounter for general adult medical examination without abnormal findings: Secondary | ICD-10-CM

## 2014-09-28 MED ORDER — CLONIDINE HCL 0.2 MG PO TABS: ORAL_TABLET | ORAL | Status: DC

## 2014-09-28 MED ORDER — AMLODIPINE BESYLATE 10 MG PO TABS: ORAL_TABLET | ORAL | Status: DC

## 2014-09-28 MED ORDER — LOSARTAN POTASSIUM-HCTZ 100-25 MG PO TABS: 1.0000 | ORAL_TABLET | Freq: Every day | ORAL | Status: DC

## 2014-09-28 MED ORDER — METOPROLOL SUCCINATE ER 200 MG PO TB24: ORAL_TABLET | ORAL | Status: DC

## 2014-09-28 NOTE — Progress Notes (Signed)
   Subjective:    Patient ID: [name], male    DOB: 1956/01/01, 58 y.o.   MRN: [id number]  HPI 58 yr old male for a cpx. He feels well. His BP still goes up and down. He was due for a colonoscopy last April but was not contacted by the GI office.    Review of Systems  Constitutional: Negative.   HENT: Negative.   Eyes: Negative.   Respiratory: Negative.   Cardiovascular: Negative.   Gastrointestinal: Negative.   Genitourinary: Negative.   Musculoskeletal: Negative.   Skin: Negative.   Neurological: Negative.   Psychiatric/Behavioral: Negative.        Objective:   Physical Exam  Constitutional: He is oriented to person, place, and time. He appears well-developed and well-nourished. No distress.  HENT:  Head: Normocephalic and atraumatic.  Right Ear: External ear normal.  Left Ear: External ear normal.  Nose: Nose normal.  Mouth/Throat: Oropharynx is clear and moist. No oropharyngeal exudate.  Eyes: Conjunctivae and EOM are normal. Pupils are equal, round, and reactive to light. Right eye exhibits no discharge. Left eye exhibits no discharge. No scleral icterus.  Neck: Neck supple. No JVD present. No tracheal deviation present. No thyromegaly present.  Cardiovascular: Normal rate, regular rhythm, normal heart sounds and intact distal pulses.  Exam reveals no gallop and no friction rub.   No murmur heard. EKG normal   Pulmonary/Chest: Effort normal and breath sounds normal. No respiratory distress. He has no wheezes. He has no rales. He exhibits no tenderness.  Abdominal: Soft. Bowel sounds are normal. He exhibits no distension and no mass. There is no tenderness. There is no rebound and no guarding.  Genitourinary: Rectum normal, prostate normal and penis normal. Guaiac negative stool. No penile tenderness.  Musculoskeletal: Normal range of motion. He exhibits no edema or tenderness.  Lymphadenopathy:    He has no cervical adenopathy.  Neurological: He is alert and  oriented to person, place, and time. He has normal reflexes. No cranial nerve deficit. He exhibits normal muscle tone. Coordination normal.  Skin: Skin is warm and dry. No rash noted. He is not diaphoretic. No erythema. No pallor.  Psychiatric: He has a normal mood and affect. His behavior is normal. Judgment and thought content normal.          Assessment & Plan:  Well exam. Set up for another colonoscopy.

## 2014-09-28 NOTE — Progress Notes (Signed)
Pre visit review using our clinic review tool, if applicable. No additional management support is needed unless otherwise documented below in the visit note. 

## 2014-10-01 ENCOUNTER — Encounter: Payer: Self-pay | Admitting: Internal Medicine

## 2014-10-22 ENCOUNTER — Encounter (HOSPITAL_COMMUNITY)
Admission: EM | Disposition: A | Discharge status: Home / Self Care | Payer: Self-pay | Source: Home / Self Care | Attending: Emergency Medicine

## 2014-10-22 ENCOUNTER — Other Ambulatory Visit: Payer: Self-pay | Admitting: Physician Assistant

## 2014-10-22 ENCOUNTER — Emergency Department (HOSPITAL_COMMUNITY): Payer: BC Managed Care – PPO

## 2014-10-22 ENCOUNTER — Observation Stay (HOSPITAL_COMMUNITY)
Admission: EM | Admit: 2014-10-22 | Discharge: 2014-10-22 | Disposition: A | Discharge status: Home / Self Care | Payer: BC Managed Care – PPO | Attending: Internal Medicine | Admitting: Internal Medicine

## 2014-10-22 ENCOUNTER — Encounter (HOSPITAL_COMMUNITY): Payer: Self-pay | Admitting: *Deleted

## 2014-10-22 DIAGNOSIS — I739 Peripheral vascular disease, unspecified: Secondary | ICD-10-CM

## 2014-10-22 DIAGNOSIS — E871 Hypo-osmolality and hyponatremia: Secondary | ICD-10-CM

## 2014-10-22 DIAGNOSIS — I209 Angina pectoris, unspecified: Secondary | ICD-10-CM

## 2014-10-22 DIAGNOSIS — R079 Chest pain, unspecified: Secondary | ICD-10-CM

## 2014-10-22 DIAGNOSIS — K219 Gastro-esophageal reflux disease without esophagitis: Secondary | ICD-10-CM

## 2014-10-22 DIAGNOSIS — Z7982 Long term (current) use of aspirin: Secondary | ICD-10-CM | POA: Insufficient documentation

## 2014-10-22 DIAGNOSIS — I70213 Atherosclerosis of native arteries of extremities with intermittent claudication, bilateral legs: Secondary | ICD-10-CM | POA: Diagnosis not present

## 2014-10-22 DIAGNOSIS — R1013 Epigastric pain: Secondary | ICD-10-CM | POA: Diagnosis present

## 2014-10-22 DIAGNOSIS — Z8249 Family history of ischemic heart disease and other diseases of the circulatory system: Secondary | ICD-10-CM | POA: Insufficient documentation

## 2014-10-22 DIAGNOSIS — F1910 Other psychoactive substance abuse, uncomplicated: Secondary | ICD-10-CM

## 2014-10-22 DIAGNOSIS — I700 Atherosclerosis of aorta: Secondary | ICD-10-CM

## 2014-10-22 DIAGNOSIS — I10 Essential (primary) hypertension: Secondary | ICD-10-CM | POA: Diagnosis not present

## 2014-10-22 DIAGNOSIS — R0789 Other chest pain: Principal | ICD-10-CM | POA: Insufficient documentation

## 2014-10-22 DIAGNOSIS — Z720 Tobacco use: Secondary | ICD-10-CM | POA: Diagnosis present

## 2014-10-22 DIAGNOSIS — F17210 Nicotine dependence, cigarettes, uncomplicated: Secondary | ICD-10-CM | POA: Insufficient documentation

## 2014-10-22 DIAGNOSIS — I200 Unstable angina: Secondary | ICD-10-CM

## 2014-10-22 HISTORY — PX: LEFT HEART CATHETERIZATION WITH CORONARY ANGIOGRAM: SHX5451

## 2014-10-22 LAB — HEPATIC FUNCTION PANEL
ALBUMIN: 3.4 g/dL — AB (ref 3.5–5.2)
ALT: 15 U/L (ref 0–53)
AST: 18 U/L (ref 0–37)
Alkaline Phosphatase: 83 U/L (ref 39–117)
Total Bilirubin: 0.4 mg/dL (ref 0.3–1.2)
Total Protein: 5.9 g/dL — ABNORMAL LOW (ref 6.0–8.3)

## 2014-10-22 LAB — BASIC METABOLIC PANEL
ANION GAP: 15 (ref 5–15)
Anion gap: 13 (ref 5–15)
BUN: 8 mg/dL (ref 6–23)
BUN: 8 mg/dL (ref 6–23)
CHLORIDE: 85 meq/L — AB (ref 96–112)
CO2: 26 mEq/L (ref 19–32)
CO2: 24 meq/L (ref 19–32)
Calcium: 8.6 mg/dL (ref 8.4–10.5)
Calcium: 8.6 mg/dL (ref 8.4–10.5)
Chloride: 83 mEq/L — ABNORMAL LOW (ref 96–112)
Creatinine, Ser: 0.73 mg/dL (ref 0.50–1.35)
Creatinine, Ser: 0.69 mg/dL (ref 0.50–1.35)
GFR calc Af Amer: >90 mL/min (ref >90)
GFR calc Af Amer: >90 mL/min (ref >90)
GFR calc non Af Amer: >90 mL/min (ref >90)
GFR calc non Af Amer: >90 mL/min (ref >90)
GLUCOSE: 103 mg/dL — AB (ref 70–99)
GLUCOSE: 104 mg/dL — AB (ref 70–99)
POTASSIUM: 4 meq/L (ref 3.7–5.3)
Potassium: 3.6 mEq/L — ABNORMAL LOW (ref 3.7–5.3)
SODIUM: 124 meq/L — AB (ref 137–147)
Sodium: 122 mEq/L — ABNORMAL LOW (ref 137–147)

## 2014-10-22 LAB — PROTIME-INR
INR: 0.99 (ref 0.00–1.49)
Prothrombin Time: 13.2 seconds (ref 11.6–15.2)

## 2014-10-22 LAB — CBC
HEMATOCRIT: 37 % — AB (ref 39.0–52.0)
HEMATOCRIT: 37.8 % — AB (ref 39.0–52.0)
HEMOGLOBIN: 13.7 g/dL (ref 13.0–17.0)
Hemoglobin: 13.7 g/dL (ref 13.0–17.0)
MCH: 32.4 pg (ref 26.0–34.0)
MCH: 31.9 pg (ref 26.0–34.0)
MCHC: 37 g/dL — AB (ref 30.0–36.0)
MCHC: 36.2 g/dL — AB (ref 30.0–36.0)
MCV: 87.5 fL (ref 78.0–100.0)
MCV: 88.1 fL (ref 78.0–100.0)
PLATELETS: 253 10*3/uL (ref 150–400)
Platelets: 258 10*3/uL (ref 150–400)
RBC: 4.23 MIL/uL (ref 4.22–5.81)
RBC: 4.29 MIL/uL (ref 4.22–5.81)
RDW: 12.3 % (ref 11.5–15.5)
RDW: 12.3 % (ref 11.5–15.5)
WBC: 6.3 10*3/uL (ref 4.0–10.5)
WBC: 10 10*3/uL (ref 4.0–10.5)

## 2014-10-22 LAB — LIPID PANEL
CHOLESTEROL: 136 mg/dL (ref 0–200)
HDL: 41 mg/dL (ref >39)
LDL Cholesterol: 87 mg/dL (ref 0–99)
TRIGLYCERIDES: 38 mg/dL (ref <150)
Total CHOL/HDL Ratio: 3.3 RATIO
VLDL: 8 mg/dL (ref 0–40)

## 2014-10-22 LAB — SODIUM, URINE, RANDOM

## 2014-10-22 LAB — I-STAT TROPONIN, ED: Troponin i, poc: 0.01 ng/mL (ref 0.00–0.08)

## 2014-10-22 LAB — TSH: TSH: 0.897 u[IU]/mL (ref 0.350–4.500)

## 2014-10-22 LAB — CREATININE, SERUM
Creatinine, Ser: 0.71 mg/dL (ref 0.50–1.35)
GFR calc Af Amer: >90 mL/min (ref >90)
GFR calc non Af Amer: >90 mL/min (ref >90)

## 2014-10-22 LAB — TROPONIN I
Troponin I: <0.3 ng/mL (ref <0.30)
Troponin I: <0.3 ng/mL (ref <0.30)

## 2014-10-22 LAB — LIPASE, BLOOD: Lipase: 25 U/L (ref 11–59)

## 2014-10-22 LAB — OSMOLALITY, URINE: Osmolality, Ur: 124 mOsm/kg — ABNORMAL LOW (ref 390–1090)

## 2014-10-22 LAB — OSMOLALITY: Osmolality: 251 mOsm/kg — ABNORMAL LOW (ref 275–300)

## 2014-10-22 SURGERY — LEFT HEART CATHETERIZATION WITH CORONARY ANGIOGRAM
Anesthesia: LOCAL

## 2014-10-22 MED ORDER — NITROGLYCERIN 1 MG/10 ML FOR IR/CATH LAB
INTRA_ARTERIAL | Status: AC
  Filled 2014-10-22: qty 10

## 2014-10-22 MED ORDER — PANTOPRAZOLE SODIUM 40 MG PO TBEC
40.0000 mg | DELAYED_RELEASE_TABLET | Freq: Once | ORAL | Status: AC
  Administered 2014-10-22: 40 mg via ORAL
  Filled 2014-10-22: qty 1

## 2014-10-22 MED ORDER — HEPARIN SODIUM (PORCINE) 1000 UNIT/ML IJ SOLN
INTRAMUSCULAR | Status: AC
  Filled 2014-10-22: qty 1

## 2014-10-22 MED ORDER — VERAPAMIL HCL 2.5 MG/ML IV SOLN
INTRAVENOUS | Status: DC
Start: 2014-10-22 — End: 2014-10-22
  Filled 2014-10-22: qty 2

## 2014-10-22 MED ORDER — MORPHINE SULFATE 4 MG/ML IJ SOLN
4.0000 mg | Freq: Once | INTRAMUSCULAR | Status: AC
  Administered 2014-10-22: 4 mg via INTRAVENOUS
  Filled 2014-10-22: qty 1

## 2014-10-22 MED ORDER — MORPHINE SULFATE 2 MG/ML IJ SOLN: 2.0000 mg | INTRAMUSCULAR | Status: DC | PRN

## 2014-10-22 MED ORDER — SODIUM CHLORIDE 0.9 % IJ SOLN: 3.0000 mL | INTRAMUSCULAR | Status: DC | PRN

## 2014-10-22 MED ORDER — SODIUM CHLORIDE 0.9 % IV SOLN
INTRAVENOUS | Status: DC
  Administered 2014-10-22: 07:00:00 via INTRAVENOUS

## 2014-10-22 MED ORDER — LIDOCAINE HCL (PF) 1 % IJ SOLN
INTRAMUSCULAR | Status: AC
  Filled 2014-10-22: qty 30

## 2014-10-22 MED ORDER — INFLUENZA VAC SPLIT QUAD 0.5 ML IM SUSY: 0.5000 mL | PREFILLED_SYRINGE | INTRAMUSCULAR | Status: DC

## 2014-10-22 MED ORDER — PANTOPRAZOLE SODIUM 20 MG PO TBEC: 20.0000 mg | DELAYED_RELEASE_TABLET | Freq: Every day | ORAL | Status: DC

## 2014-10-22 MED ORDER — ASPIRIN 81 MG PO CHEW: 81.0000 mg | CHEWABLE_TABLET | ORAL | Status: DC

## 2014-10-22 MED ORDER — AMLODIPINE BESYLATE 5 MG PO TABS
5.0000 mg | ORAL_TABLET | Freq: Two times a day (BID) | ORAL | Status: DC
  Filled 2014-10-22: qty 1

## 2014-10-22 MED ORDER — ACETAMINOPHEN 650 MG RE SUPP: 650.0000 mg | Freq: Four times a day (QID) | RECTAL | Status: DC | PRN

## 2014-10-22 MED ORDER — SODIUM CHLORIDE 0.9 % IJ SOLN: 3.0000 mL | Freq: Two times a day (BID) | INTRAMUSCULAR | Status: DC

## 2014-10-22 MED ORDER — CLONIDINE HCL 0.2 MG PO TABS
0.2000 mg | ORAL_TABLET | Freq: Two times a day (BID) | ORAL | Status: DC
  Administered 2014-10-22: 0.2 mg via ORAL
  Filled 2014-10-22: qty 1

## 2014-10-22 MED ORDER — SODIUM CHLORIDE 0.9 % IV SOLN: INTRAVENOUS | Status: DC

## 2014-10-22 MED ORDER — SODIUM CHLORIDE 0.9 % IJ SOLN
3.0000 mL | Freq: Two times a day (BID) | INTRAMUSCULAR | Status: DC
  Administered 2014-10-22: 3 mL via INTRAVENOUS

## 2014-10-22 MED ORDER — GI COCKTAIL ~~LOC~~
30.0000 mL | Freq: Once | ORAL | Status: AC
  Administered 2014-10-22: 30 mL via ORAL
  Filled 2014-10-22: qty 30

## 2014-10-22 MED ORDER — PANTOPRAZOLE SODIUM 20 MG PO TBEC
20.0000 mg | DELAYED_RELEASE_TABLET | Freq: Every day | ORAL | Status: DC
  Administered 2014-10-22: 20 mg via ORAL
  Filled 2014-10-22 (×2): qty 1

## 2014-10-22 MED ORDER — ZOLPIDEM TARTRATE 5 MG PO TABS: 5.0000 mg | ORAL_TABLET | Freq: Every evening | ORAL | Status: DC | PRN

## 2014-10-22 MED ORDER — ONDANSETRON HCL 4 MG/2ML IJ SOLN: 4.0000 mg | Freq: Four times a day (QID) | INTRAMUSCULAR | Status: DC | PRN

## 2014-10-22 MED ORDER — HYDROCODONE-ACETAMINOPHEN 5-325 MG PO TABS
1.0000 | ORAL_TABLET | ORAL | Status: DC | PRN
  Administered 2014-10-22: 2 via ORAL
  Filled 2014-10-22: qty 2

## 2014-10-22 MED ORDER — ASPIRIN 81 MG PO CHEW
81.0000 mg | CHEWABLE_TABLET | Freq: Every day | ORAL | Status: DC
  Filled 2014-10-22: qty 1

## 2014-10-22 MED ORDER — ASPIRIN 81 MG PO CHEW: 81.0000 mg | CHEWABLE_TABLET | Freq: Every day | ORAL | Status: DC

## 2014-10-22 MED ORDER — ACETAMINOPHEN 325 MG PO TABS: 650.0000 mg | ORAL_TABLET | ORAL | Status: DC | PRN

## 2014-10-22 MED ORDER — INFLUENZA VAC SPLIT QUAD 0.5 ML IM SUSY
0.5000 mL | PREFILLED_SYRINGE | Freq: Once | INTRAMUSCULAR | Status: AC
  Administered 2014-10-22: 0.5 mL via INTRAMUSCULAR
  Filled 2014-10-22 (×2): qty 0.5

## 2014-10-22 MED ORDER — LOSARTAN POTASSIUM 50 MG PO TABS
50.0000 mg | ORAL_TABLET | Freq: Two times a day (BID) | ORAL | Status: DC
  Administered 2014-10-22: 50 mg via ORAL
  Filled 2014-10-22: qty 1

## 2014-10-22 MED ORDER — ASPIRIN 81 MG PO CHEW
81.0000 mg | CHEWABLE_TABLET | Freq: Once | ORAL | Status: AC
  Administered 2014-10-22: 81 mg via ORAL
  Filled 2014-10-22: qty 1

## 2014-10-22 MED ORDER — SODIUM CHLORIDE 0.9 % IV SOLN: 250.0000 mL | INTRAVENOUS | Status: DC | PRN

## 2014-10-22 MED ORDER — ACETAMINOPHEN 325 MG PO TABS: 650.0000 mg | ORAL_TABLET | Freq: Four times a day (QID) | ORAL | Status: DC | PRN

## 2014-10-22 MED ORDER — SODIUM CHLORIDE 0.9 % IV SOLN
INTRAVENOUS | Status: AC
Start: 2014-10-22 — End: 2014-10-22

## 2014-10-22 MED ORDER — SODIUM CHLORIDE 0.9 % IV SOLN
INTRAVENOUS | Status: DC
Start: 2014-10-22 — End: 2014-10-22

## 2014-10-22 MED ORDER — NICOTINE 14 MG/24HR TD PT24
14.0000 mg | MEDICATED_PATCH | Freq: Every day | TRANSDERMAL | Status: DC
  Administered 2014-10-22: 14 mg via TRANSDERMAL
  Filled 2014-10-22: qty 1

## 2014-10-22 MED ORDER — ONDANSETRON HCL 4 MG PO TABS: 4.0000 mg | ORAL_TABLET | Freq: Four times a day (QID) | ORAL | Status: DC | PRN

## 2014-10-22 MED ORDER — ENOXAPARIN SODIUM 40 MG/0.4ML ~~LOC~~ SOLN
40.0000 mg | SUBCUTANEOUS | Status: DC
  Filled 2014-10-22: qty 0.4

## 2014-10-22 MED ORDER — METOPROLOL SUCCINATE ER 100 MG PO TB24
100.0000 mg | ORAL_TABLET | Freq: Two times a day (BID) | ORAL | Status: DC
  Administered 2014-10-22: 100 mg via ORAL
  Filled 2014-10-22: qty 1

## 2014-10-22 MED ORDER — HEPARIN (PORCINE) IN NACL 2-0.9 UNIT/ML-% IJ SOLN
INTRAMUSCULAR | Status: AC
  Filled 2014-10-22: qty 1500

## 2014-10-22 NOTE — Progress Notes (Signed)
Lower extremity dopplers ordered for the clinic.  Follow up arranged.  Wilburt FinlayBryan Antonetta Clanton, PA-C

## 2014-10-22 NOTE — CV Procedure (Signed)
Deveron FurlongJeffrey B Valone is a 58 y.o. male    [id number] LOCATION:  FACILITY: [facility]  PHYSICIAN: Nanetta BattyJonathan Froilan Mclean, M.D. 1956/09/26   DATE OF PROCEDURE:  10/22/2014  DATE OF DISCHARGE:     CARDIAC CATHETERIZATION     History obtained from chart review.Mr. Orvan FalconerCampbell is a 58 year old married Caucasian male admitted by Dr. Melburn PopperNasher today for unstable angina. He was awakened with chest pain. He ruled out for myocardial infarction by enzymes and had no acute ST or T wave changes. He does complain or claudication. His risk factors include treated hypertension and tobacco abuse. He presents now for cardiac catheterization to define his anatomy.   PROCEDURE DESCRIPTION:   The patient was brought to the second floor  [facility] lab in the postabsorptive state. He was not premedicated . His right groinwas prepped and shaved in usual sterile fashion. Xylocaine 1% was used  for local anesthesia. A 5 French sheath was inserted into the right common femoral artery using standard Seldinger technique. 5 French right and left Judkins diagnostic catheters along with a 5 French pigtail catheter were used for selective coronary angiography, left ventriculography and distal abdominal aortography. Visipaque dye was used for the entirety of the case. Retrograde aortic, left ventricular and pullback pressures were recorded.   HEMODYNAMICS:    AO SYSTOLIC/AO DIASTOLIC: 114/57   LV SYSTOLIC/LV DIASTOLIC: 105/10  ANGIOGRAPHIC RESULTS:   1. Left main; normal  2. LAD; normal 3. Left circumflex; normal.  4. Right coronary artery; dominant and normal 5. Left ventriculography; RAO left ventriculogram was performed using  25 mL of Visipaque dye at 12 mL/second. The overall LVEF estimated  60 %  Without wall motion abnormalities 6: Abdominal aortogram-the renal arteries are widely patent. The distal abdominal aorta and iliac bifurcation were fluoroscopically calcified. 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  IMPRESSION:Mr. Orvan FalconerCampbell has normal coronary arteries and normal left ventricular function. I believe his chest pain was noncardiac. Medical therapy will be recommended including antireflux measures. 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 The sheath was removed and pressure was held on the groin to achieve hemostasis. The patient left the lab in stable condition. He'll be gently hydrated and discharged home after 4 hours of remaining recumbent. I will get lower extremity arterial Doppler [facility] after which I will see him back for PV evaluation.  Runell GessBERRY,Malissie Musgrave J. MD, [facility] 10/22/2014 11:43 AM

## 2014-10-22 NOTE — Progress Notes (Signed)
Patient ID: Adam FurlongJeffrey B [name]  male  JXB:[id number]    DOB: [birth year]    DOA: 10/22/2014  PCP: Nelwyn SalisburyFRY,STEPHEN A, MD  Assessment/Plan: Principal Problem:   Chest pain - Chest infiltrate resolved, EKG showed T-wave inversions in V2,V3, new from EKG few weeks ago - cardiology consulted, NPO, will schedule for cath today - BP control, lipid panel  Active Problems: Acute on chronic hyponatremia - obtain a serum was negative, urinalysis negative, urine sodium, hold HCTZ - increase IV fluids to 100cc/hr, currently nothing by mouth    Essential hypertension - discontinue HCTZ, - Continue beta blocker, losartan, amlodipine, clonidine    Nicotine abuse - placed on nicotine patch    GERD (gastroesophageal reflux disease) - Placed on PPI   DVT Prophylaxis:  Code Status: Full code  Family Communication: Discussed with patient's wife at the bedside  Disposition:  Consultants:  Cardiology  Procedures:  None  Antibiotics:  None    Subjective: Patient seen and examined, no chest pain at this time, no shortness of breath or fevers or chills  Objective: Weight change:  No intake or output data in the 24 hours ending 10/22/14 0859 Blood pressure 131/74, pulse 69, temperature 98.2 F (36.8 C), temperature source Oral, resp. rate 15, height 5\' 7"  (1.702 m), weight 64.456 kg (142 lb 1.6 oz), SpO2 92 %.  Physical Exam: General: Alert and awake, oriented x3, not in any acute distress. HEENT: anicteric sclera, PERLA, EOMI CVS: S1-S2 clear, no murmur rubs or gallops Chest: clear to auscultation bilaterally, no wheezing, rales or rhonchi Abdomen: soft nontender, nondistended, normal bowel sounds  Extremities: no cyanosis, clubbing or edema noted bilaterally Neuro: Cranial nerves II-XII intact, no focal neurological deficits  Lab Results: Basic Metabolic Panel:  Recent Labs Lab 10/22/14 0130 10/22/14 0630  NA 122* 124*  K 3.6* 4.0  CL 83* 85*  CO2 26 24  GLUCOSE 103*  104*  BUN 8 8  CREATININE 0.73 0.69  0.71  CALCIUM 8.6 8.6   Liver Function Tests:  Recent Labs Lab 10/22/14 0130  AST 18  ALT 15  ALKPHOS 83  BILITOT 0.4  PROT 5.9*  ALBUMIN 3.4*    Recent Labs Lab 10/22/14 0130  LIPASE 25   No results for input(s): AMMONIA in the last 168 hours. CBC:  Recent Labs Lab 10/22/14 0130 10/22/14 0630  WBC 6.3 10.0  HGB 13.7 13.7  HCT 37.0* 37.8*  MCV 87.5 88.1  PLT 258 253   Cardiac Enzymes:  Recent Labs Lab 10/22/14 0630  TROPONINI <0.30   BNP: Invalid input(s): POCBNP CBG: No results for input(s): GLUCAP in the last 168 hours.   Micro Results: No results found for this or any previous visit (from the past 240 hour(s)).  Studies/Results: Dg Chest Port 1 View  10/22/2014   CLINICAL DATA:  Acute onset of mid to lower chest pain and upper mid abdominal pain.  EXAM: PORTABLE CHEST - 1 VIEW  COMPARISON:  None.  FINDINGS: The lungs are well-aerated. Minimal bibasilar atelectasis is noted. Mild peribronchial thickening is seen. There is no evidence of pleural effusion or pneumothorax.  The cardiomediastinal silhouette is within normal limits. No acute osseous abnormalities are seen.  IMPRESSION: Minimal bibasilar atelectasis noted; mild peribronchial thickening seen.   Electronically Signed   By: Roanna RaiderJeffery  Chang M.D.   On: 10/22/2014 01:41    Medications: Scheduled Meds: . amLODipine  5 mg Oral BID  . [START ON 10/23/2014] aspirin  81 mg Oral Pre-Cath  .  cloNIDine  0.2 mg Oral BID  . enoxaparin (LOVENOX) injection  40 mg Subcutaneous Q24H  . [START ON 10/23/2014] Influenza vac split quadrivalent PF  0.5 mL Intramuscular Tomorrow-1000  . losartan  50 mg Oral BID  . metoprolol  100 mg Oral BID  . pantoprazole  20 mg Oral Daily  . sodium chloride  3 mL Intravenous Q12H  . sodium chloride  3 mL Intravenous Q12H      LOS: 0 days   RAI,RIPUDEEP M.D. [facility] 10/22/2014, 8:59 AM Pager: [phone]  If 7PM-7AM,  please contact night-coverage [email or web address] Password [email or web address]

## 2014-10-22 NOTE — Progress Notes (Signed)
Pt discharged home with wife.  Reviewed discharge instructions and education, all questions answered.  Assessment unchanged from earlier.  

## 2014-10-22 NOTE — Progress Notes (Signed)
Site area: rt groin Site Prior to Removal:  Level 0 Pressure Applied For: 20 minutes Manual:   yes Patient Status During Pull:  yes Post Pull Site:  Level 0 Post Pull Instructions Given:  yes Post Pull Pulses Present: yes Dressing Applied:  tegaderm Bedrest begins @ 1210 Comments: no complications

## 2014-10-22 NOTE — ED Notes (Signed)
Pt arrives via EMS from home. States at 2300 he awoke with chest pain and sweating. States that she pain has relocated to the epigastrium. EMS reports [facility]x [facility] CBG 101, EKG negative. EMS administered 50 mcg fentanyl.

## 2014-10-22 NOTE — Plan of Care (Signed)
Problem: Consults Goal: Tobacco Cessation referral if indicated Outcome: Completed/Met Date Met:  10/22/14 Goal: Nutrition Consult-if indicated Outcome: Not Applicable Date Met:  83/50/75  Problem: Phase I Progression Outcomes Goal: MD aware of Cardiac Marker results Outcome: Completed/Met Date Met:  10/22/14  Problem: Phase II Progression Outcomes Goal: Hemodynamically stable Outcome: Completed/Met Date Met:  10/22/14 Goal: Anginal pain relieved Outcome: Progressing Goal: Stress Test if indicated Outcome: Not Applicable Date Met:  73/22/56 Goal: Cath/PCI Day Path if indicated Outcome: Completed/Met Date Met:  10/22/14 Goal: CV Risk Factors identified Outcome: Completed/Met Date Met:  10/22/14 Goal: Cardiac Rehab if ordered Outcome: Not Applicable Date Met:  72/09/19 Goal: If positive for MI, change to MI Path Outcome: Not Applicable Date Met:  80/22/17  Problem: Phase III Progression Outcomes Goal: Hemodynamically stable Outcome: Completed/Met Date Met:  10/22/14 Goal: No anginal pain Outcome: Progressing Goal: Cath/PCI Path as indicated Outcome: Completed/Met Date Met:  10/22/14 Goal: Discharge plan remains appropriate-arrangements made Outcome: Completed/Met Date Met:  10/22/14 Goal: Tolerating diet Outcome: Completed/Met Date Met:  10/22/14 Goal: If positive for MI, change to MI Path Outcome: Not Applicable Date Met:  98/10/25  Problem: Discharge Progression Outcomes Goal: Hemodynamically stable Outcome: Completed/Met Date Met:  48/62/82 Goal: Complications resolved/controlled Outcome: Progressing Goal: Barriers To Progression Addressed/Resolved Outcome: Completed/Met Date Met:  10/22/14 Goal: Discharge plan in place and appropriate Outcome: Completed/Met Date Met:  10/22/14 Goal: Tolerates diet Outcome: Completed/Met Date Met:  10/22/14 Goal: Activity appropriate for discharge plan Outcome: Completed/Met Date Met:  10/22/14

## 2014-10-22 NOTE — Discharge Summary (Signed)
Physician Discharge Summary  Patient ID: [name] MRN: [id number] DOB/AGE: 57-Nov-1957 57 y.o.  Admit date: 10/22/2014 Discharge date: 10/22/2014  Primary Care Physician:  Nelwyn SalisburyFRY,STEPHEN A, MD  Discharge Diagnoses:    . Chest pain, non cardiac likely GERD . Essential hypertension . Nicotine abuse . GERD (gastroesophageal reflux disease) . Hyponatremia  Consults:  Cardiology, Dr Elease HashimotoNahser   Recommendations for Outpatient Follow-up:  Patient was recommended to STOP HCTZ due to hyponatremia. Please adjust his antihypertensives.   Please check BMET at follow-up appointment   Allergies:   Allergies  Allergen Reactions  . Erythromycin     REACTION: Raw throat     Discharge Medications:   Medication List    STOP taking these medications        losartan-hydrochlorothiazide 100-25 MG per tablet  Commonly known as:  HYZAAR      TAKE these medications        amLODipine 10 MG tablet  Commonly known as:  NORVASC  TAKE 1 TABLET DAILY     aspirin 81 MG chewable tablet  Chew 1 tablet (81 mg total) by mouth daily.  Start taking on:  10/23/2014     cloNIDine 0.2 MG tablet  Commonly known as:  CATAPRES  TAKE (1) TABLET TWICE A DAY.     Fish Oil Oil  Take by mouth 2 (two) times daily.     losartan 100 MG tablet  Commonly known as:  COZAAR  Take 50 mg by mouth 2 (two) times daily.     metoprolol 200 MG 24 hr tablet  Commonly known as:  TOPROL-XL  TAKE 1 TABLET DAILY     pantoprazole 20 MG tablet  Commonly known as:  PROTONIX  Take 1 tablet (20 mg total) by mouth daily.         Brief H and P: For complete details please refer to admission H and P, but in brief [name] is a 58 y.o. male, with past medical history significant for hypertension and heavy smoking presenting with one-day history of substernal/epigastric pain associated with cold sweats but no shortness of breath or nausea. The pain was described as sharp and nonradiating on and off with  no relieving or exacerbating factors.   Hospital Course:  Chest pain likely GERD, non cardiac  Patient presented with CP which woke him up from sleep and associated with diaphoresis.  EKG showed T-wave inversions in V2,V3, new from EKG few weeks ago. Cardiology was consulted and recommended cardiac cath for further work-up.  Cardiac cath showed EF 60%, no obstructive coronary artery disease.  Patient was recommended PPI. He has cardiology follow-up on 12/15.  Acute on chronic hyponatremia: likely due to HCTZ TSH 0.897, urine osm 124, Urine Na <20, calculated serum osmolarity 262, patient was gently hydrated and HCTZ was discontinued. Na improved from 120 to 124. Patient reported chronic hyponatremia, currently alert and oriented.   Essential hypertension Discontinue HCTZ, - Continue beta blocker, losartan, amlodipine, clonidine, adjust antihypertensives outpatient.   Nicotine abuse - placed on nicotine patch   GERD (gastroesophageal reflux disease) - Placed on PPI   Day of Discharge BP 105/64 mmHg  Pulse 59  Temp(Src) 98.2 F (36.8 C) (Oral)  Resp 13  Ht 5\' 7"  (1.702 m)  Wt 64.456 kg (142 lb 1.6 oz)  BMI 22.25 kg/m2  SpO2 96%  Physical Exam: General: Alert and awake oriented x3 not in any acute distress. HEENT: anicteric sclera, pupils reactive to light and accommodation CVS: S1-S2 clear no  murmur rubs or gallops Chest: clear to auscultation bilaterally, no wheezing rales or rhonchi Abdomen: soft nontender, nondistended, normal bowel sounds Extremities: no cyanosis, clubbing or edema noted bilaterally Neuro: Cranial nerves II-XII intact, no focal neurological deficits   The results of significant diagnostics from this hospitalization (including imaging, microbiology, ancillary and laboratory) are listed below for reference.    LAB RESULTS: Basic Metabolic Panel:  Recent Labs Lab 10/22/14 0130 10/22/14 0630  NA 122* 124*  K 3.6* 4.0  CL 83* 85*  CO2 26 24   GLUCOSE 103* 104*  BUN 8 8  CREATININE 0.73 0.69  0.71  CALCIUM 8.6 8.6   Liver Function Tests:  Recent Labs Lab 10/22/14 0130  AST 18  ALT 15  ALKPHOS 83  BILITOT 0.4  PROT 5.9*  ALBUMIN 3.4*    Recent Labs Lab 10/22/14 0130  LIPASE 25   No results for input(s): AMMONIA in the last 168 hours. CBC:  Recent Labs Lab 10/22/14 0130 10/22/14 0630  WBC 6.3 10.0  HGB 13.7 13.7  HCT 37.0* 37.8*  MCV 87.5 88.1  PLT 258 253   Cardiac Enzymes:  Recent Labs Lab 10/22/14 0630  TROPONINI <0.30   BNP: Invalid input(s): POCBNP CBG: No results for input(s): GLUCAP in the last 168 hours.  Significant Diagnostic Studies:  Dg Chest Port 1 View  10/22/2014   CLINICAL DATA:  Acute onset of mid to lower chest pain and upper mid abdominal pain.  EXAM: PORTABLE CHEST - 1 VIEW  COMPARISON:  None.  FINDINGS: The lungs are well-aerated. Minimal bibasilar atelectasis is noted. Mild peribronchial thickening is seen. There is no evidence of pleural effusion or pneumothorax.  The cardiomediastinal silhouette is within normal limits. No acute osseous abnormalities are seen.  IMPRESSION: Minimal bibasilar atelectasis noted; mild peribronchial thickening seen.   Electronically Signed   By: Roanna RaiderJeffery  Chang M.D.   On: 10/22/2014 01:41       Disposition and Follow-up:     Discharge Instructions    Diet - low sodium heart healthy    Complete by:  As directed      Discharge instructions    Complete by:  As directed   Please STOP taking hydrochlorothiazide. If your SBP is running in low 90's-100's, you can stop losartan. You need to discuss your BP medications with your primary care physician.     Increase activity slowly    Complete by:  As directed             DISPOSITION:home  DIET: heart healthy     DISCHARGE FOLLOW-UP Follow-up Information    Follow up with HAGER, BRYAN, PA-C On 11/06/2014.   Specialty:  Physician Assistant   Why:  8:30 AM   Contact information:    [address] [place] [address] [phone]       Follow up with FRY,STEPHEN A, MD. Schedule an appointment as soon as possible for a visit in 10 days.   Specialty:  Family Medicine   Why:  for hospital follow-up, obtain labs for BMET/potassium   Contact information:   [address] [place] [address] [phone]       Time spent on Discharge: 39 mins  Signed:   RAI,RIPUDEEP M.D. [facility] 10/22/2014, 1:42 PM Pager: [phone]

## 2014-10-22 NOTE — H&P (View-Only) (Signed)
CONSULT NOTE  Date: 10/22/2014               Patient Name:  [name] MRN: [id number]  DOB: Feb 21, 1956 Age / Sex: 58 y.o., male        PCP: Gershon Crane A Primary Cardiologist: New -Nahser            Referring Physician: Rai              Reason for Consult: Chest pressure            History of Present Illness: Patient is a 58 y.o. male with a PMHx of HTN, cigarette smoking , who was admitted to [facility] on 10/22/2014 for evaluation of  Chest pressure.   . The patient describes chest pressure in the middle of his chest. It was associated with diaphoresis. Some radiation down into his abdomen. The pain lasted for about 30 minutes.  It woke him up from sleep.    The patient works as a Naval architect. He does not get any regular exercise. He does not have any episodes of chest pain with his usual daily activities.  He describes symptoms of claudication when he walks.  Family hx of HTN and CAD  Father has a pacer.    Medications: Outpatient medications: Prescriptions prior to admission  Medication Sig Dispense Refill Last Dose  . amLODipine (NORVASC) 10 MG tablet TAKE 1 TABLET DAILY (Patient taking differently: Take 5 mg by mouth 2 (two) times daily. ) 90 tablet 3 10/21/2014 at Unknown time  . cloNIDine (CATAPRES) 0.2 MG tablet TAKE (1) TABLET TWICE A DAY. 180 tablet 3 10/21/2014 at Unknown time  . Fish Oil OIL Take by mouth 2 (two) times daily.   10/21/2014 at Unknown time  . losartan (COZAAR) 100 MG tablet Take 50 mg by mouth 2 (two) times daily.   10/21/2014 at Unknown time  . metoprolol (TOPROL-XL) 200 MG 24 hr tablet TAKE 1 TABLET DAILY (Patient taking differently: Take 100 mg by mouth 2 (two) times daily. ) 90 tablet 3 10/21/2014 at 0800  . losartan-hydrochlorothiazide (HYZAAR) 100-25 MG per tablet Take 1 tablet by mouth daily. (Patient not taking: Reported on 10/22/2014) 90 tablet 3     Current medications: Current Facility-Administered Medications  Medication  Dose Route Frequency Provider Last Rate Last Dose  . 0.9 %  sodium chloride infusion   Intravenous Continuous Carron Curie, MD 50 mL/hr at 10/22/14 [phone]    . acetaminophen (TYLENOL) tablet 650 mg  650 mg Oral Q6H PRN Carron Curie, MD       Or  . acetaminophen (TYLENOL) suppository 650 mg  650 mg Rectal Q6H PRN Carron Curie, MD      . amLODipine (NORVASC) tablet 5 mg  5 mg Oral BID Carron Curie, MD      . cloNIDine (CATAPRES) tablet 0.2 mg  0.2 mg Oral BID Carron Curie, MD      . enoxaparin (LOVENOX) injection 40 mg  40 mg Subcutaneous Q24H Carron Curie, MD      . HYDROcodone-acetaminophen (NORCO/VICODIN) 5-325 MG per tablet 1-2 tablet  1-2 tablet Oral Q4H PRN Carron Curie, MD      . Melene Muller ON 10/23/2014] Influenza vac split quadrivalent PF (FLUARIX) injection 0.5 mL  0.5 mL Intramuscular Tomorrow-1000 Carron Curie, MD      . losartan (COZAAR) tablet 50 mg  50 mg Oral BID Carron Curie, MD      . metoprolol succinate (TOPROL-XL) 24 hr tablet  100 mg  100 mg Oral BID Carron CurieAli Hijazi, MD      . morphine 2 MG/ML injection 2 mg  2 mg Intravenous Q4H PRN Carron CurieAli Hijazi, MD      . ondansetron (ZOFRAN) tablet 4 mg  4 mg Oral Q6H PRN Carron CurieAli Hijazi, MD       Or  . ondansetron (ZOFRAN) injection 4 mg  4 mg Intravenous Q6H PRN Carron CurieAli Hijazi, MD      . pantoprazole (PROTONIX) EC tablet 20 mg  20 mg Oral Daily Carron CurieAli Hijazi, MD      . sodium chloride 0.9 % injection 3 mL  3 mL Intravenous Q12H Carron CurieAli Hijazi, MD   3 mL at 10/22/14 0640  . zolpidem (AMBIEN) tablet 5 mg  5 mg Oral QHS PRN,MR X 1 Carron CurieAli Hijazi, MD         Allergies  Allergen Reactions  . Erythromycin     REACTION: Raw throat     Past Medical History  Diagnosis Date  . Hypertension   . Erectile dysfunction   . Concussion 08-2007  . Fracture of right hand 06-2010    Past Surgical History  Procedure Laterality Date  . Colonoscopy  02-25-09    per Dr. Jarold MottoPatterson, multiple polyps and severe diverticulosis, repeat in 5 yrs    Family History  Problem Relation Age of Onset  .  Arthritis    . Coronary artery disease    . Hypertension      Social History:  reports that he has been smoking Cigarettes.  He has a 35 pack-year smoking history. He has never used smokeless tobacco. He reports that he drinks about 2.4 oz of alcohol per week. He reports that he does not use illicit drugs.   Review of Systems: Constitutional:  denies fever, chills, diaphoresis, appetite change and fatigue.  HEENT: denies photophobia, eye pain, redness, hearing loss, ear pain, congestion, sore throat, rhinorrhea, sneezing, neck pain, neck stiffness and tinnitus.  Respiratory: admits to   chest tightness,    Cardiovascular: admits to chest pain,  Denies  palpitations and leg swelling.  Gastrointestinal: denies nausea, vomiting, abdominal pain, diarrhea, constipation, blood in stool.  Genitourinary: denies dysuria, urgency, frequency, hematuria, flank pain and difficulty urinating.  Musculoskeletal: denies  myalgias, back pain, joint swelling, arthralgias and gait problem.   Skin: denies pallor, rash and wound.  Neurological: denies dizziness, seizures, syncope, weakness, light-headedness, numbness and headaches.   Hematological: denies adenopathy, easy bruising, personal or family bleeding history.  Psychiatric/ Behavioral: denies suicidal ideation, mood changes, confusion, nervousness, sleep disturbance and agitation.    Physical Exam: BP 131/74 mmHg  Pulse 69  Temp(Src) 98.2 F (36.8 C) (Oral)  Resp 15  Ht 5\' 7"  (1.702 m)  Wt 142 lb 1.6 oz (64.456 kg)  BMI 22.25 kg/m2  SpO2 92%  Wt Readings from Last 3 Encounters:  10/22/14 142 lb 1.6 oz (64.456 kg)  09/28/14 153 lb (69.4 kg)  03/05/14 150 lb (68.04 kg)    General: Vital signs reviewed and noted. Well-developed, well-nourished, in no acute distress; alert,   Head: Normocephalic, atraumatic, sclera anicteric,   Neck: Supple. Negative for carotid bruits. No JVD   Lungs:  Clear bilaterally, no  wheezes, rales, or rhonchi.  Breathing is normal   Heart: RRR with S1 S2. No murmurs, rubs, or gallops   Abdomen:  Soft, non-tender, non-distended with normoactive bowel sounds. No hepatomegaly. No rebound/guarding. No obvious abdominal masses   MSK: Strength and the appear normal for age.  Extremities: No clubbing or cyanosis. No edema.  Distal pedal pulses are 1+ and equal . Radial pulses are good.   Neurologic: Alert and oriented X 3. Moves all extremities spontaneously.  Psych: Responds to questions appropriately with a normal affect.     Lab results: Basic Metabolic Panel:  Recent Labs Lab 10/22/14 0130 10/22/14 0630  NA 122*  --   K 3.6*  --   CL 83*  --   CO2 26  --   GLUCOSE 103*  --   BUN 8  --   CREATININE 0.73 0.71  CALCIUM 8.6  --     Liver Function Tests:  Recent Labs Lab 10/22/14 0130  AST 18  ALT 15  ALKPHOS 83  BILITOT 0.4  PROT 5.9*  ALBUMIN 3.4*    Recent Labs Lab 10/22/14 0130  LIPASE 25   No results for input(s): AMMONIA in the last 168 hours.  CBC:  Recent Labs Lab 10/22/14 0130 10/22/14 0630  WBC 6.3 10.0  HGB 13.7 13.7  HCT 37.0* 37.8*  MCV 87.5 88.1  PLT 258 253    Cardiac Enzymes:  Recent Labs Lab 10/22/14 0630  TROPONINI <0.30    BNP: Invalid input(s): POCBNP  CBG: No results for input(s): GLUCAP in the last 168 hours.  Coagulation Studies: No results for input(s): LABPROT, INR in the last 72 hours.   Other results:  EKG : Normal sinus rhythm. He has T-wave inversions in lead V2 and V3. These changes are new from his previous EKG 3-4 weeks ago.   Imaging: Dg Chest Port 1 View  10/22/2014   CLINICAL DATA:  Acute onset of mid to lower chest pain and upper mid abdominal pain.  EXAM: PORTABLE CHEST - 1 VIEW  COMPARISON:  None.  FINDINGS: The lungs are well-aerated. Minimal bibasilar atelectasis is noted. Mild peribronchial thickening is seen. There is no evidence of pleural effusion or pneumothorax.  The cardiomediastinal silhouette is  within normal limits. No acute osseous abnormalities are seen.  IMPRESSION: Minimal bibasilar atelectasis noted; mild peribronchial thickening seen.   Electronically Signed   By: Roanna RaiderJeffery  Chang M.D.   On: 10/22/2014 01:41        Assessment & Plan:  1. Unstable angina: The patient presents with symptoms that are consistent with unstable angina. He had chest pressure that woke him from sleep. It lasted about 30 minutes and was eventually relieved by EMS with fentanyl and I presume nitroglycerin. He has new EKG changes. He has a history of hypertension as well as cigar smoking.  He has a family history of cardiac disease.  We will schedule him for a cardiac catheterization. We discussed the risks, benefits, and options of cardiac cath. He understands and agrees to proceed.  2. HTN:  His blood pressures currently well-controlled.    Vesta MixerPhilip J. Nahser, Montez HagemanJr., MD, [facility]ACC 10/22/2014, 8:17 AM Office - [phone] Pager [phone]

## 2014-10-22 NOTE — Interval H&P Note (Signed)
Cath Lab Visit (complete for each Cath Lab visit)  Clinical Evaluation Leading to the Procedure:   ACS: Yes.    Non-ACS:    Anginal Classification: CCS IV  Anti-ischemic medical therapy: Maximal Therapy (2 or more classes of medications)  Non-Invasive Test Results: No non-invasive testing performed  Prior CABG: No previous CABG      History and Physical Interval Note:  10/22/2014 11:02 AM  [name]  has presented today for surgery, with the diagnosis of cp  The various methods of treatment have been discussed with the patient and family. After consideration of risks, benefits and other options for treatment, the patient has consented to  Procedure(s): LEFT HEART CATHETERIZATION WITH CORONARY ANGIOGRAM (N/A) as a surgical intervention .  The patient's history has been reviewed, patient examined, no change in status, stable for surgery.  I have reviewed the patient's chart and labs.  Questions were answered to the patient's satisfaction.     Runell GessBERRY,JONATHAN J

## 2014-10-22 NOTE — H&P (Addendum)
[facility]                                                                                    Patient Demographics  [name], is a 58 y.o. male  CSN: [id number]  MRN: [id number]  DOB - September 09, 1956  Admit Date - 10/22/2014  Outpatient Primary MD for the patient is Nelwyn Salisbury, MD   With History of -  Past Medical History  Diagnosis Date  . Hypertension   . Erectile dysfunction   . Concussion 08-2007  . Fracture of right hand 06-2010      Past Surgical History  Procedure Laterality Date  . Colonoscopy  02-25-09    per Dr. Jarold Motto, multiple polyps and severe diverticulosis, repeat in 5 yrs    in for   Chief Complaint  Patient presents with  . Abdominal Pain  . Chest Pain     HPI  [name]  is a 58 y.o. male, with past medical history significant for hypertension and heavy smoking presenting with one-day history of substernal/epigastric pain associated with cold sweats but no shortness of breath or nausea. The pain was described as sharp and is nonradiating on and off with no relieving or exacerbating factors. Patient denies any palpitations and had a physical a few months ago which was normal. No stress test in the past reported.    Review of Systems    In addition to the HPI above,  No Fever-chills, No Headache, No changes with Vision or hearing, No problems swallowing food or Liquids, No  Cough or Shortness of Breath, No Nausea or Vommitting, Bowel movements are regular, No Blood in stool or Urine, No dysuria, No new skin rashes or bruises, No new joints pains-aches,  No new weakness, tingling, numbness in any extremity, No recent weight gain or loss, No polyuria, polydypsia or polyphagia, No significant Mental Stressors.  A full 10 point Review of Systems was done, except as stated above, all other Review of Systems were negative.   Social History History  Substance Use Topics  . Smoking status: Current Every Day  Smoker -- 1.00 packs/day for 35 years    Types: Cigarettes  . Smokeless tobacco: Never Used  . Alcohol Use: 2.4 oz/week    4 Not specified per week     Family History Family History  Problem Relation Age of Onset  . Arthritis    . Coronary artery disease    . Hypertension       Prior to Admission medications   Medication Sig Start Date End Date Taking? Authorizing Provider  amLODipine (NORVASC) 10 MG tablet TAKE 1 TABLET DAILY Patient taking differently: Take 5 mg by mouth 2 (two) times daily.  09/28/14  Yes Nelwyn Salisbury, MD  cloNIDine (CATAPRES) 0.2 MG tablet TAKE (1) TABLET TWICE A DAY. 09/28/14  Yes Nelwyn Salisbury, MD  Fish Oil OIL Take by mouth 2 (two) times daily.   Yes Historical Provider, MD  losartan (COZAAR) 100 MG tablet Take 50 mg by mouth 2 (two) times daily.   Yes Historical Provider, MD  metoprolol (TOPROL-XL) 200 MG 24 hr tablet TAKE 1 TABLET DAILY Patient taking  differently: Take 100 mg by mouth 2 (two) times daily.  09/28/14  Yes Nelwyn SalisburyStephen A Fry, MD  losartan-hydrochlorothiazide (HYZAAR) 100-25 MG per tablet Take 1 tablet by mouth daily. Patient not taking: Reported on 10/22/2014 09/28/14   Nelwyn SalisburyStephen A Fry, MD    Allergies  Allergen Reactions  . Erythromycin     REACTION: Raw throat    Physical Exam  Vitals  Blood pressure 117/70, pulse 70, temperature 97.6 F (36.4 C), temperature source Oral, resp. rate 15, height 5\' 7"  (1.702 m), weight 70.761 kg (156 lb), SpO2 93 %.   1. General well-developed, well-nourished male, looks tired  2.  Not Suicidal or Homicidal, Awake Alert, Oriented X 3.  3. No gross F.N deficits, ALL C.Nerves Intact,  4. Ears and Eyes appear Normal, Conjunctivae clear, PERRLA. Moist Oral Mucosa.  5. Supple Neck, No JVD, No cervical lymphadenopathy appriciated, No Carotid Bruits.  6. Symmetrical Chest wall movement, Good air movement bilaterally, CTAB.  7. RRR, No Gallops, Rubs or Murmurs, No Parasternal Heave.  8. Positive Bowel  Sounds, Abdomen Soft, Non tender, No organomegaly appriciated,No rebound -guarding or rigidity.  9.  No Cyanosis, Normal Skin Turgor, No Skin Rash or Bruise.  10. Good muscle tone,  joints appear normal , no effusions, Normal ROM.  11. No Palpable Lymph Nodes in Neck or Axillae    Data Review  CBC  Recent Labs Lab 10/22/14 0130  WBC 6.3  HGB 13.7  HCT 37.0*  PLT 258  MCV 87.5  MCH 32.4  MCHC 37.0*  RDW 12.3   ------------------------------------------------------------------------------------------------------------------  Chemistries   Recent Labs Lab 10/22/14 0130  NA 122*  K 3.6*  CL 83*  CO2 26  GLUCOSE 103*  BUN 8  CREATININE 0.73  CALCIUM 8.6  AST 18  ALT 15  ALKPHOS 83  BILITOT 0.4   ------------------------------------------------------------------------------------------------------------------ estimated creatinine clearance is 95.2 mL/min (by C-G formula based on Cr of 0.73). ------------------------------------------------------------------------------------------------------------------ No results for input(s): TSH, T4TOTAL, T3FREE, THYROIDAB in the last 72 hours.  Invalid input(s): FREET3   Coagulation profile No results for input(s): INR, PROTIME in the last 168 hours. ------------------------------------------------------------------------------------------------------------------- No results for input(s): DDIMER in the last 72 hours. -------------------------------------------------------------------------------------------------------------------  Cardiac Enzymes No results for input(s): CKMB, TROPONINI, MYOGLOBIN in the last 168 hours.  Invalid input(s): CK ------------------------------------------------------------------------------------------------------------------ Invalid input(s): POCBNP   ---------------------------------------------------------------------------------------------------------------  Urinalysis     Component Value Date/Time   COLORURINE yellow 01/04/2009 0803   APPEARANCEUR Clear 01/04/2009 0803   LABSPEC 1.015 01/04/2009 0803   PHURINE 7.5 01/04/2009 0803   HGBUR negative 01/04/2009 0803   BILIRUBINUR neg 09/21/2014 0847   BILIRUBINUR negative 01/04/2009 0803   PROTEINUR neg 09/21/2014 0847   UROBILINOGEN 1.0 09/21/2014 0847   UROBILINOGEN 2.0 01/04/2009 0803   NITRITE neg 09/21/2014 0847   NITRITE negative 01/04/2009 0803   LEUKOCYTESUR Negative 09/21/2014 0847    ----------------------------------------------------------------------------------------------------------------   Imaging results:   Dg Chest Port 1 View  10/22/2014   CLINICAL DATA:  Acute onset of mid to lower chest pain and upper mid abdominal pain.  EXAM: PORTABLE CHEST - 1 VIEW  COMPARISON:  None.  FINDINGS: The lungs are well-aerated. Minimal bibasilar atelectasis is noted. Mild peribronchial thickening is seen. There is no evidence of pleural effusion or pneumothorax.  The cardiomediastinal silhouette is within normal limits. No acute osseous abnormalities are seen.  IMPRESSION: Minimal bibasilar atelectasis noted; mild peribronchial thickening seen.   Electronically Signed   By: Roanna RaiderJeffery  Chang M.D.   On: 10/22/2014  01:41    My personal review of EKG: Normal sinus rhythm at 67 bpm, nonspecific T-wave changes in the anterior leads    Assessment & Plan  1. Chest pain ; improved slightly with GI cocktail and morphine, will probably need more workup     Serial troponins     Check EKG in the a.m.     Start Protonix 2. Hyponatremia     DC hydrochlorothiazide and continue on Cozaar     Check TSH 3. Hypertension     Continue Cozaar and metoprolol   DVT Prophylaxis Lovenox  AM Labs Ordered, also please review Full Orders  Family Communication: Admission, patients condition and plan of care including tests being ordered have been discussed with the patient and wife who indicate understanding and agree  with the plan and Code Status.  Code Status full  Disposition Plan: Home  Time spent in minutes : 32 minutes  Condition GUARDED   @SIGNATURE @

## 2014-10-22 NOTE — ED Provider Notes (Addendum)
CSN: [id number]     Arrival date & time 10/22/14  0044 History  This chart was scribed for [name] Rieley Hausman, MD by Bronson CurbJacqueline Melvin, ED Scribe. This patient was seen in room B18C/B18C and the patient's care was started at 2:21 AM.   Chief Complaint  Patient presents with  . Abdominal Pain  . Chest Pain    The history is provided by the patient. No language interpreter was used.    HPI Comments: Adam FurlongJeffrey B [name] is a 58 y.o. male, with history of HTN brought in by ambulance, who presents to the Emergency Department complaining of lower sternal chest pain onset approxiately 2.5 hours ago at 2345. He states the pain woke him from sleep and has since radiated to the epigastric region of the abdomen. He reports associated diaphoresis, but states this has resolved. He also notes that his legs occasionally "give out" after ambualting short distances. Patient denies history of the same, and reports having a normal physical 1 month ago. Patient has family history of CAD. He denies SOB, nausea, vomiting, or fever. Patient is a current PPD smoker.      Past Medical History  Diagnosis Date  . Hypertension   . Erectile dysfunction   . Concussion 08-2007  . Fracture of right hand 06-2010   Past Surgical History  Procedure Laterality Date  . Colonoscopy  02-25-09    per Dr. Jarold MottoPatterson, multiple polyps and severe diverticulosis, repeat in 5 yrs   Family History  Problem Relation Age of Onset  . Arthritis    . Coronary artery disease    . Hypertension     History  Substance Use Topics  . Smoking status: Current Every Day Smoker -- 1.00 packs/day for 35 years    Types: Cigarettes  . Smokeless tobacco: Never Used  . Alcohol Use: 2.4 oz/week    4 Not specified per week    Review of Systems  Constitutional: Positive for diaphoresis (resolved). Negative for fever.  Respiratory: Negative for shortness of breath.   Cardiovascular: Positive for chest pain.  Gastrointestinal: Positive for abdominal  pain (epigastric). Negative for nausea and vomiting.  All other systems reviewed and are negative.     Allergies  Erythromycin  Home Medications   Prior to Admission medications   Medication Sig Start Date End Date Taking? Authorizing Provider  amLODipine (NORVASC) 10 MG tablet TAKE 1 TABLET DAILY Patient taking differently: Take 5 mg by mouth 2 (two) times daily.  09/28/14  Yes Nelwyn SalisburyStephen A Fry, MD  cloNIDine (CATAPRES) 0.2 MG tablet TAKE (1) TABLET TWICE A DAY. 09/28/14  Yes Nelwyn SalisburyStephen A Fry, MD  Fish Oil OIL Take by mouth 2 (two) times daily.   Yes Historical Provider, MD  losartan (COZAAR) 100 MG tablet Take 50 mg by mouth 2 (two) times daily.   Yes Historical Provider, MD  metoprolol (TOPROL-XL) 200 MG 24 hr tablet TAKE 1 TABLET DAILY Patient taking differently: Take 100 mg by mouth 2 (two) times daily.  09/28/14  Yes Nelwyn SalisburyStephen A Fry, MD  losartan-hydrochlorothiazide (HYZAAR) 100-25 MG per tablet Take 1 tablet by mouth daily. Patient not taking: Reported on 10/22/2014 09/28/14   Nelwyn SalisburyStephen A Fry, MD   Triage Vitals: BP 123/72 mmHg  Pulse 76  Temp(Src) 97.6 F (36.4 C) (Oral)  Resp 18  Ht 5\' 7"  (1.702 m)  Wt 156 lb (70.761 kg)  BMI 24.43 kg/m2  SpO2 90%  Physical Exam  Constitutional: He is oriented to person, place, and time. He appears well-developed  and well-nourished. No distress.  HENT:  Head: Normocephalic and atraumatic.  Eyes: Conjunctivae and EOM are normal. Pupils are equal, round, and reactive to light.  Neck: Normal range of motion. Neck supple. No JVD present. No tracheal deviation present.  Cardiovascular: Normal rate, regular rhythm and normal heart sounds.   No murmur heard. Pulmonary/Chest: Effort normal. No respiratory distress. He has no wheezes. He has no rales.  Abdominal: Soft. Bowel sounds are normal. He exhibits no distension and no mass. There is tenderness in the epigastric area.  Moderate epigastric tenderness.  Musculoskeletal: Normal range of motion. He  exhibits no edema.  Dorsalis pedis pulses 1+. Normal capillary refill. Mild venous stasis changes.  Lymphadenopathy:    He has no cervical adenopathy.  Neurological: He is alert and oriented to person, place, and time. He has normal reflexes. No cranial nerve deficit. Coordination normal.  Skin: Skin is warm and dry. No rash noted.  Psychiatric: He has a normal mood and affect. His behavior is normal. Thought content normal.  Nursing note and vitals reviewed.   ED Course  Procedures (including critical care time)  DIAGNOSTIC STUDIES: Oxygen Saturation is 90% on room air, adequate by my interpretation.    COORDINATION OF CARE: At 0228 Discussed treatment plan with patient which includes labs. Patient agrees.    Labs Review Results for orders placed or performed during the hospital encounter of 10/22/14  CBC  Result Value Ref Range   WBC 6.3 4.0 - 10.5 K/uL   RBC 4.23 4.22 - 5.81 MIL/uL   Hemoglobin 13.7 13.0 - 17.0 g/dL   HCT 96.0 (L) 45.4 - 09.8 %   MCV 87.5 78.0 - 100.0 fL   MCH 32.4 26.0 - 34.0 pg   MCHC 37.0 (H) 30.0 - 36.0 g/dL   RDW 11.9 14.7 - 82.9 %   Platelets 258 150 - 400 K/uL  Basic metabolic panel  Result Value Ref Range   Sodium 122 (L) 137 - 147 mEq/L   Potassium 3.6 (L) 3.7 - 5.3 mEq/L   Chloride 83 (L) 96 - 112 mEq/L   CO2 26 19 - 32 mEq/L   Glucose, Bld 103 (H) 70 - 99 mg/dL   BUN 8 6 - 23 mg/dL   Creatinine, Ser 5.62 0.50 - 1.35 mg/dL   Calcium 8.6 8.4 - 13.0 mg/dL   GFR calc non Af Amer >90 >90 mL/min   GFR calc Af Amer >90 >90 mL/min   Anion gap 13 5 - 15  Hepatic function panel  Result Value Ref Range   Total Protein 5.9 (L) 6.0 - 8.3 g/dL   Albumin 3.4 (L) 3.5 - 5.2 g/dL   AST 18 0 - 37 U/L   ALT 15 0 - 53 U/L   Alkaline Phosphatase 83 39 - 117 U/L   Total Bilirubin 0.4 0.3 - 1.2 mg/dL   Bilirubin, Direct <8.6 0.0 - 0.3 mg/dL   Indirect Bilirubin NOT CALCULATED 0.3 - 0.9 mg/dL  Lipase, blood  Result Value Ref Range   Lipase 25 11 - 59  U/L  I-stat troponin, ED (not at [facility])  Result Value Ref Range   Troponin i, poc 0.01 0.00 - 0.08 ng/mL   Comment 3            Imaging Review Dg Chest Port 1 View  10/22/2014   CLINICAL DATA:  Acute onset of mid to lower chest pain and upper mid abdominal pain.  EXAM: PORTABLE CHEST - 1 VIEW  COMPARISON:  None.  FINDINGS: The lungs are well-aerated. Minimal bibasilar atelectasis is noted. Mild peribronchial thickening is seen. There is no evidence of pleural effusion or pneumothorax.  The cardiomediastinal silhouette is within normal limits. No acute osseous abnormalities are seen.  IMPRESSION: Minimal bibasilar atelectasis noted; mild peribronchial thickening seen.   Electronically Signed   By: Roanna RaiderJeffery  Chang M.D.   On: 10/22/2014 01:41     EKG Interpretation   Date/Time:  Monday October 22 2014 00:52:49 EST Ventricular Rate:  67 PR Interval:  197 QRS Duration: 95 QT Interval:  410 QTC Calculation: 433 R Axis:   84 Text Interpretation:  Sinus rhythm Borderline T abnormalities, anterior  leads No old tracing to compare Confirmed by [facility] IncGLICK  MD, Floyd Wade ([id number]) on  10/22/2014 12:55:31 AM      MDM   Final diagnoses:  Chest pain, unspecified chest pain type  Epigastric pain  Hyponatremia    Chest pain suspicious for gastroesophageal reflux since it seems to now be localized in the epigastric area. However, patient does have significant risk factors of tobacco use and hypertension. He also has leg symptoms which may be consistent with claudication. He is given a GI cocktail with partial improvement in pain. Pain level has been reduced to 5/10. I feel he needs to be admitted for serial cardiac markers. He is given morphine for pain as well as a dose of oral pantoprazole and he became pain-free. At this point, case was discussed with Dr. Sharyon MedicusHijazi [facility] who agrees to admit him under observation status. Incidental finding of hyponatremia. He has had hyponatremia in the past but  this is significantly worse. He does not appear to be symptomatic from this and I suspect this is from his hydrochlorothiazide medication.  I personally performed the services described in this documentation, which was scribed in my presence. The recorded information has been reviewed and is accurate.     [name] Yachet Mattson, MD 10/22/14 [id number]  [name] Niley Helbig, MD 10/22/14 [phone]

## 2014-10-22 NOTE — Consult Note (Signed)
CONSULT NOTE  Date: 10/22/2014               Patient Name:  [name] MRN: [id number]  DOB: Feb 21, 1956 Age / Sex: 58 y.o., male        PCP: Gershon Crane A Primary Cardiologist: New -Veeda Virgo            Referring Physician: Rai              Reason for Consult: Chest pressure            History of Present Illness: Patient is a 58 y.o. male with a PMHx of HTN, cigarette smoking , who was admitted to [facility] on 10/22/2014 for evaluation of  Chest pressure.   . The patient describes chest pressure in the middle of his chest. It was associated with diaphoresis. Some radiation down into his abdomen. The pain lasted for about 30 minutes.  It woke him up from sleep.    The patient works as a Naval architect. He does not get any regular exercise. He does not have any episodes of chest pain with his usual daily activities.  He describes symptoms of claudication when he walks.  Family hx of HTN and CAD  Father has a pacer.    Medications: Outpatient medications: Prescriptions prior to admission  Medication Sig Dispense Refill Last Dose  . amLODipine (NORVASC) 10 MG tablet TAKE 1 TABLET DAILY (Patient taking differently: Take 5 mg by mouth 2 (two) times daily. ) 90 tablet 3 10/21/2014 at Unknown time  . cloNIDine (CATAPRES) 0.2 MG tablet TAKE (1) TABLET TWICE A DAY. 180 tablet 3 10/21/2014 at Unknown time  . Fish Oil OIL Take by mouth 2 (two) times daily.   10/21/2014 at Unknown time  . losartan (COZAAR) 100 MG tablet Take 50 mg by mouth 2 (two) times daily.   10/21/2014 at Unknown time  . metoprolol (TOPROL-XL) 200 MG 24 hr tablet TAKE 1 TABLET DAILY (Patient taking differently: Take 100 mg by mouth 2 (two) times daily. ) 90 tablet 3 10/21/2014 at 0800  . losartan-hydrochlorothiazide (HYZAAR) 100-25 MG per tablet Take 1 tablet by mouth daily. (Patient not taking: Reported on 10/22/2014) 90 tablet 3     Current medications: Current Facility-Administered Medications  Medication  Dose Route Frequency Provider Last Rate Last Dose  . 0.9 %  sodium chloride infusion   Intravenous Continuous Carron Curie, MD 50 mL/hr at 10/22/14 [phone]    . acetaminophen (TYLENOL) tablet 650 mg  650 mg Oral Q6H PRN Carron Curie, MD       Or  . acetaminophen (TYLENOL) suppository 650 mg  650 mg Rectal Q6H PRN Carron Curie, MD      . amLODipine (NORVASC) tablet 5 mg  5 mg Oral BID Carron Curie, MD      . cloNIDine (CATAPRES) tablet 0.2 mg  0.2 mg Oral BID Carron Curie, MD      . enoxaparin (LOVENOX) injection 40 mg  40 mg Subcutaneous Q24H Carron Curie, MD      . HYDROcodone-acetaminophen (NORCO/VICODIN) 5-325 MG per tablet 1-2 tablet  1-2 tablet Oral Q4H PRN Carron Curie, MD      . Melene Muller ON 10/23/2014] Influenza vac split quadrivalent PF (FLUARIX) injection 0.5 mL  0.5 mL Intramuscular Tomorrow-1000 Carron Curie, MD      . losartan (COZAAR) tablet 50 mg  50 mg Oral BID Carron Curie, MD      . metoprolol succinate (TOPROL-XL) 24 hr tablet  100 mg  100 mg Oral BID Carron CurieAli Hijazi, MD      . morphine 2 MG/ML injection 2 mg  2 mg Intravenous Q4H PRN Carron CurieAli Hijazi, MD      . ondansetron (ZOFRAN) tablet 4 mg  4 mg Oral Q6H PRN Carron CurieAli Hijazi, MD       Or  . ondansetron (ZOFRAN) injection 4 mg  4 mg Intravenous Q6H PRN Carron CurieAli Hijazi, MD      . pantoprazole (PROTONIX) EC tablet 20 mg  20 mg Oral Daily Carron CurieAli Hijazi, MD      . sodium chloride 0.9 % injection 3 mL  3 mL Intravenous Q12H Carron CurieAli Hijazi, MD   3 mL at 10/22/14 0640  . zolpidem (AMBIEN) tablet 5 mg  5 mg Oral QHS PRN,MR X 1 Carron CurieAli Hijazi, MD         Allergies  Allergen Reactions  . Erythromycin     REACTION: Raw throat     Past Medical History  Diagnosis Date  . Hypertension   . Erectile dysfunction   . Concussion 08-2007  . Fracture of right hand 06-2010    Past Surgical History  Procedure Laterality Date  . Colonoscopy  02-25-09    per Dr. Jarold MottoPatterson, multiple polyps and severe diverticulosis, repeat in 5 yrs    Family History  Problem Relation Age of Onset  .  Arthritis    . Coronary artery disease    . Hypertension      Social History:  reports that he has been smoking Cigarettes.  He has a 35 pack-year smoking history. He has never used smokeless tobacco. He reports that he drinks about 2.4 oz of alcohol per week. He reports that he does not use illicit drugs.   Review of Systems: Constitutional:  denies fever, chills, diaphoresis, appetite change and fatigue.  HEENT: denies photophobia, eye pain, redness, hearing loss, ear pain, congestion, sore throat, rhinorrhea, sneezing, neck pain, neck stiffness and tinnitus.  Respiratory: admits to   chest tightness,    Cardiovascular: admits to chest pain,  Denies  palpitations and leg swelling.  Gastrointestinal: denies nausea, vomiting, abdominal pain, diarrhea, constipation, blood in stool.  Genitourinary: denies dysuria, urgency, frequency, hematuria, flank pain and difficulty urinating.  Musculoskeletal: denies  myalgias, back pain, joint swelling, arthralgias and gait problem.   Skin: denies pallor, rash and wound.  Neurological: denies dizziness, seizures, syncope, weakness, light-headedness, numbness and headaches.   Hematological: denies adenopathy, easy bruising, personal or family bleeding history.  Psychiatric/ Behavioral: denies suicidal ideation, mood changes, confusion, nervousness, sleep disturbance and agitation.    Physical Exam: BP 131/74 mmHg  Pulse 69  Temp(Src) 98.2 F (36.8 C) (Oral)  Resp 15  Ht 5\' 7"  (1.702 m)  Wt 142 lb 1.6 oz (64.456 kg)  BMI 22.25 kg/m2  SpO2 92%  Wt Readings from Last 3 Encounters:  10/22/14 142 lb 1.6 oz (64.456 kg)  09/28/14 153 lb (69.4 kg)  03/05/14 150 lb (68.04 kg)    General: Vital signs reviewed and noted. Well-developed, well-nourished, in no acute distress; alert,   Head: Normocephalic, atraumatic, sclera anicteric,   Neck: Supple. Negative for carotid bruits. No JVD   Lungs:  Clear bilaterally, no  wheezes, rales, or rhonchi.  Breathing is normal   Heart: RRR with S1 S2. No murmurs, rubs, or gallops   Abdomen:  Soft, non-tender, non-distended with normoactive bowel sounds. No hepatomegaly. No rebound/guarding. No obvious abdominal masses   MSK: Strength and the appear normal for age.  Extremities: No clubbing or cyanosis. No edema.  Distal pedal pulses are 1+ and equal . Radial pulses are good.   Neurologic: Alert and oriented X 3. Moves all extremities spontaneously.  Psych: Responds to questions appropriately with a normal affect.     Lab results: Basic Metabolic Panel:  Recent Labs Lab 10/22/14 0130 10/22/14 0630  NA 122*  --   K 3.6*  --   CL 83*  --   CO2 26  --   GLUCOSE 103*  --   BUN 8  --   CREATININE 0.73 0.71  CALCIUM 8.6  --     Liver Function Tests:  Recent Labs Lab 10/22/14 0130  AST 18  ALT 15  ALKPHOS 83  BILITOT 0.4  PROT 5.9*  ALBUMIN 3.4*    Recent Labs Lab 10/22/14 0130  LIPASE 25   No results for input(s): AMMONIA in the last 168 hours.  CBC:  Recent Labs Lab 10/22/14 0130 10/22/14 0630  WBC 6.3 10.0  HGB 13.7 13.7  HCT 37.0* 37.8*  MCV 87.5 88.1  PLT 258 253    Cardiac Enzymes:  Recent Labs Lab 10/22/14 0630  TROPONINI <0.30    BNP: Invalid input(s): POCBNP  CBG: No results for input(s): GLUCAP in the last 168 hours.  Coagulation Studies: No results for input(s): LABPROT, INR in the last 72 hours.   Other results:  EKG : Normal sinus rhythm. He has T-wave inversions in lead V2 and V3. These changes are new from his previous EKG 3-4 weeks ago.   Imaging: Dg Chest Port 1 View  10/22/2014   CLINICAL DATA:  Acute onset of mid to lower chest pain and upper mid abdominal pain.  EXAM: PORTABLE CHEST - 1 VIEW  COMPARISON:  None.  FINDINGS: The lungs are well-aerated. Minimal bibasilar atelectasis is noted. Mild peribronchial thickening is seen. There is no evidence of pleural effusion or pneumothorax.  The cardiomediastinal silhouette is  within normal limits. No acute osseous abnormalities are seen.  IMPRESSION: Minimal bibasilar atelectasis noted; mild peribronchial thickening seen.   Electronically Signed   By: Roanna RaiderJeffery  Chang M.D.   On: 10/22/2014 01:41        Assessment & Plan:  1. Unstable angina: The patient presents with symptoms that are consistent with unstable angina. He had chest pressure that woke him from sleep. It lasted about 30 minutes and was eventually relieved by EMS with fentanyl and I presume nitroglycerin. He has new EKG changes. He has a history of hypertension as well as cigar smoking.  He has a family history of cardiac disease.  We will schedule him for a cardiac catheterization. We discussed the risks, benefits, and options of cardiac cath. He understands and agrees to proceed.  2. HTN:  His blood pressures currently well-controlled.    Vesta MixerPhilip J. Yarelie Hams, Montez HagemanJr., MD, [facility]ACC 10/22/2014, 8:17 AM Office - [phone] Pager [phone]

## 2014-10-22 NOTE — Progress Notes (Signed)
UR completed 

## 2014-10-22 NOTE — Discharge Instructions (Signed)
PLEASE REMEMBER TO BRING ALL OF YOUR MEDICATIONS TO EACH OF YOUR FOLLOW-UP OFFICE VISITS. ° °PLEASE ATTEND ALL SCHEDULED FOLLOW-UP APPOINTMENTS.  ° °Activity: Increase activity slowly as tolerated. You may shower, but no soaking baths (or swimming) for 1 week. No driving for 2 days. No lifting over 5 lbs for 1 week. No sexual activity for 1 week.  ° °You May Return to Work: in 1 week (if applicable) ° °Wound Care: You may wash cath site gently with soap and water. Keep cath site clean and dry. If you notice pain, swelling, bleeding or pus at your cath site, please call [phone]. ° ° ° °Cardiac Cath Site Care °Refer to this sheet in the next few weeks. These instructions provide you with information on caring for yourself after your procedure. Your caregiver may also give you more specific instructions. Your treatment has been planned according to current medical practices, but problems sometimes occur. Call your caregiver if you have any problems or questions after your procedure. °HOME CARE INSTRUCTIONS °· You may shower 24 hours after the procedure. Remove the bandage (dressing) and gently wash the site with plain soap and water. Gently pat the site dry.  °· Do not apply powder or lotion to the site.  °· Do not sit in a bathtub, swimming pool, or whirlpool for 5 to 7 days.  °· No bending, squatting, or lifting anything over 10 pounds (4.5 kg) as directed by your caregiver.  °· Inspect the site at least twice daily.  °· Do not drive home if you are discharged the same day of the procedure. Have someone else drive you.  °· You may drive 24 hours after the procedure unless otherwise instructed by your caregiver.  °What to expect: °· Any bruising will usually fade within 1 to 2 weeks.  °· Blood that collects in the tissue (hematoma) may be painful to the touch. It should usually decrease in size and tenderness within 1 to 2 weeks.  °SEEK IMMEDIATE MEDICAL CARE IF: °· You have unusual pain at the site or down the  affected limb.  °· You have redness, warmth, swelling, or pain at the site.  °· You have drainage (other than a small amount of blood on the dressing).  °· You have chills.  °· You have a fever or persistent symptoms for more than 72 hours.  °· You have a fever and your symptoms suddenly get worse.  °· Your leg becomes pale, cool, tingly, or numb.  °· You have heavy bleeding from the site. Hold pressure on the site.  °Document Released: 12/12/2010 Document Revised: 10/29/2011 Document Reviewed: 12/12/2010 °ExitCare® Patient Information ©2012 [employer]. ° °

## 2014-10-22 NOTE — Plan of Care (Signed)
Problem: Consults Goal: Chest Pain Patient Education (See Patient Education module for education specifics.) Outcome: Completed/Met Date Met:  10/22/14 Goal: Skin Care Protocol Initiated - if Braden Score 18 or less If consults are not indicated, leave blank or document N/A Outcome: Not Applicable Date Met:  40/33/53 Goal: Diabetes Guidelines if Diabetic/Glucose > 140 If diabetic or lab glucose is > 140 mg/dl - Initiate Diabetes/Hyperglycemia Guidelines & Document Interventions  Outcome: Not Applicable Date Met:  31/74/09  Problem: Phase I Progression Outcomes Goal: Hemodynamically stable Outcome: Completed/Met Date Met:  10/22/14 Goal: Anginal pain relieved Outcome: Completed/Met Date Met:  10/22/14 Goal: Aspirin unless contraindicated Outcome: Completed/Met Date Met:  10/22/14 Goal: Voiding-avoid urinary catheter unless indicated Outcome: Completed/Met Date Met:  10/22/14

## 2014-10-23 ENCOUNTER — Telehealth: Payer: Self-pay | Admitting: Family Medicine

## 2014-10-23 ENCOUNTER — Telehealth (HOSPITAL_COMMUNITY): Payer: Self-pay | Admitting: *Deleted

## 2014-10-23 ENCOUNTER — Telehealth: Payer: Self-pay | Admitting: Cardiovascular Disease

## 2014-10-23 MED ORDER — PANTOPRAZOLE SODIUM 20 MG PO TBEC: 20.0000 mg | DELAYED_RELEASE_TABLET | Freq: Every day | ORAL | Status: DC

## 2014-10-23 MED ORDER — LOSARTAN POTASSIUM 100 MG PO TABS: 50.0000 mg | ORAL_TABLET | Freq: Two times a day (BID) | ORAL | Status: DC

## 2014-10-23 MED ORDER — LOSARTAN POTASSIUM 100 MG PO TABS
50.0000 mg | ORAL_TABLET | Freq: Two times a day (BID) | ORAL | Status: DC
Start: 2014-10-23 — End: 2015-01-10

## 2014-10-23 NOTE — Telephone Encounter (Signed)
done

## 2014-10-23 NOTE — Telephone Encounter (Signed)
Rx was sent to pharmacy electronically. 

## 2014-10-23 NOTE — Telephone Encounter (Signed)
[facility] called in stating some medications were suppose to be called in for the pt and there were gonna be some slight changes. A prescription for Losartan(without HCTZ) and a medication for his acid reflux. Please call  Thanks

## 2014-10-23 NOTE — Telephone Encounter (Signed)
Pt was discharged from [facility] yesterday  they may changes to his medication. Pt needs pantoprazole 20 mg once a day #90 . Hosp changed pt to losartan 100 mg w/out hctz #90 w/refills [facility]

## 2014-10-25 ENCOUNTER — Telehealth: Payer: Self-pay | Admitting: Cardiovascular Disease

## 2014-10-25 NOTE — Telephone Encounter (Signed)
Pt called in stating that Dr.Berry did a cath on him on 11/30 and from there a release form was suppose to be sent to job verifying that it is all right for him to return to work on 12/7. He says that his job faxed that form over twice yesterday and received no response. He needs this form signed and faxed back to his job by 9 am tomorrow or he will not be able to return to work on Monday. Please call him to verify when this has been sent  Thanks

## 2014-10-25 NOTE — Telephone Encounter (Signed)
I received info from [employer].  The form was filled out.  Unfortunately, [employer] does not have a signed patient release on file.  Millie from the [employer] will contact patient for his to come by here and sign release form so work release form can be faxed to company.

## 2014-10-25 NOTE — Telephone Encounter (Signed)
Pt came in person and filled out signed patient release. Appropriate documentation of return to work notice was faxed to [employer]. Fax confirmation was given to patient.

## 2014-11-01 ENCOUNTER — Ambulatory Visit (INDEPENDENT_AMBULATORY_CARE_PROVIDER_SITE_OTHER): Payer: BC Managed Care – PPO | Admitting: Family Medicine

## 2014-11-01 ENCOUNTER — Encounter: Payer: Self-pay | Admitting: Family Medicine

## 2014-11-01 VITALS — BP 133/79 | HR 67 | Temp 98.6°F | Ht 67.0 in | Wt 154.0 lb

## 2014-11-01 DIAGNOSIS — E871 Hypo-osmolality and hyponatremia: Secondary | ICD-10-CM

## 2014-11-01 DIAGNOSIS — K219 Gastro-esophageal reflux disease without esophagitis: Secondary | ICD-10-CM

## 2014-11-01 DIAGNOSIS — I10 Essential (primary) hypertension: Secondary | ICD-10-CM

## 2014-11-01 LAB — BASIC METABOLIC PANEL
BUN: 8 mg/dL (ref 6–23)
CALCIUM: 8.7 mg/dL (ref 8.4–10.5)
CO2: 27 mEq/L (ref 19–32)
Chloride: 99 mEq/L (ref 96–112)
Creatinine, Ser: 0.8 mg/dL (ref 0.4–1.5)
GFR: 100 mL/min (ref >60.00)
Glucose, Bld: 60 mg/dL — ABNORMAL LOW (ref 70–99)
Potassium: 4.7 mEq/L (ref 3.5–5.1)
SODIUM: 132 meq/L — AB (ref 135–145)

## 2014-11-01 NOTE — Progress Notes (Signed)
Pre visit review using our clinic review tool, if applicable. No additional management support is needed unless otherwise documented below in the visit note. 

## 2014-11-01 NOTE — Progress Notes (Signed)
   Subjective:    Patient ID: [name], male    DOB: [birth year], 58 y.o.   MRN: [id number]  HPI Here to follow up a hospital stay on 10-22-14 for chest pain. He ended up getting a cardiac cath which was clean. He was diagnosed with GERD and he was started on Protonix. He has felt fine since then with no chest pain. His serum sodium level was low so the HCTZ was stopped. This needs to be rechecked. He is scheduled for an arterial doppler to the legs on 11-06-14 since some stenoses were seen in the femoral arteries. He is back to work.    Review of Systems  Constitutional: Negative.   Respiratory: Negative.   Cardiovascular: Negative.        Objective:   Physical Exam  Constitutional: He appears well-developed and well-nourished.  Cardiovascular: Normal rate, regular rhythm, normal heart sounds and intact distal pulses.   Pulmonary/Chest: Effort normal and breath sounds normal.          Assessment & Plan:  Stay on current meds. Get a BMET today

## 2014-11-06 ENCOUNTER — Ambulatory Visit (HOSPITAL_COMMUNITY)
Admission: RE | Admit: 2014-11-06 | Discharge: 2014-11-06 | Disposition: A | Discharge status: Home / Self Care | Payer: BC Managed Care – PPO | Source: Ambulatory Visit | Attending: Cardiovascular Disease | Admitting: Cardiovascular Disease

## 2014-11-06 ENCOUNTER — Ambulatory Visit (INDEPENDENT_AMBULATORY_CARE_PROVIDER_SITE_OTHER): Payer: BC Managed Care – PPO | Admitting: Physician Assistant

## 2014-11-06 ENCOUNTER — Encounter: Payer: Self-pay | Admitting: Physician Assistant

## 2014-11-06 VITALS — BP 112/75 | HR 71 | Ht 67.0 in | Wt 155.5 lb

## 2014-11-06 DIAGNOSIS — I10 Essential (primary) hypertension: Secondary | ICD-10-CM

## 2014-11-06 DIAGNOSIS — I739 Peripheral vascular disease, unspecified: Secondary | ICD-10-CM | POA: Diagnosis not present

## 2014-11-06 DIAGNOSIS — F1910 Other psychoactive substance abuse, uncomplicated: Secondary | ICD-10-CM

## 2014-11-06 DIAGNOSIS — Z720 Tobacco use: Secondary | ICD-10-CM

## 2014-11-06 NOTE — Assessment & Plan Note (Signed)
Perform follow-up arterial Dopplers in 6 months. Follow-up with Dr. Allyson SabalBerry in 2-3

## 2014-11-06 NOTE — Progress Notes (Signed)
Patient ID: [name], male   DOB: Apr 07, 1956, 58 y.o.   MRN: [id number]    Date:  11/06/2014   ID:  [name], DOB Apr 07, 1956, MRN [id number]  PCP:  Nelwyn SalisburyFRY,STEPHEN A, MD  Primary Cardiologist:  Allyson SabalBerry     History of Present Illness: [name] is a 58 y.o. male history of continued tobacco abuse, hypertension, concussion, ED. Patient was recently hospitalized with chest pain and underwent left heart catheterization which revealed normal coronary arteries and LV function.  Incidentally he was found to have high-grade bilateral aortoiliac disease.  We did lower extremity arterial Dopplers on 11/06/2014 and right common iliac artery velocities were 216 cm/s and left was 250. Right ABI 0.9 for the left of 1.2.  Patient does report that his legs tend to get tired however, distance tends to vary but can be as short as 100 feet.  The patient currently denies nausea, vomiting, fever, chest pain, shortness of breath, orthopnea, dizziness, PND, cough, congestion, abdominal pain, hematochezia, melena, lower extremity edema.  Wt Readings from Last 3 Encounters:  11/06/14 155 lb 8 oz (70.534 kg)  11/01/14 154 lb (69.854 kg)  10/22/14 142 lb 1.6 oz (64.456 kg)     Past Medical History  Diagnosis Date  . Hypertension   . Erectile dysfunction   . Concussion 08-2007  . Fracture of right hand 06-2010    Current Outpatient Prescriptions  Medication Sig Dispense Refill  . amLODipine (NORVASC) 10 MG tablet TAKE 1 TABLET DAILY (Patient taking differently: Take 5 mg by mouth 2 (two) times daily. ) 90 tablet 3  . aspirin 81 MG chewable tablet Chew 1 tablet (81 mg total) by mouth daily. 30 tablet 3  . cloNIDine (CATAPRES) 0.2 MG tablet TAKE (1) TABLET TWICE A DAY. 180 tablet 3  . Fish Oil OIL Take by mouth 2 (two) times daily.    Marland Kitchen. losartan (COZAAR) 100 MG tablet Take 0.5 tablets (50 mg total) by mouth 2 (two) times daily. 90 tablet 3  . metoprolol (TOPROL-XL) 200 MG 24 hr tablet TAKE  1 TABLET DAILY (Patient taking differently: Take 100 mg by mouth 2 (two) times daily. ) 90 tablet 3  . pantoprazole (PROTONIX) 20 MG tablet Take 1 tablet (20 mg total) by mouth daily. 90 tablet 3   No current facility-administered medications for this visit.    Allergies:    Allergies  Allergen Reactions  . Erythromycin     REACTION: Raw throat    Social History:  The patient  reports that he has been smoking Cigarettes.  He has been smoking about 0.00 packs per day for the past 35 years. He has never used smokeless tobacco. He reports that he drinks alcohol. He reports that he does not use illicit drugs.   Family history:   Family History  Problem Relation Age of Onset  . Arthritis    . Coronary artery disease    . Hypertension      ROS:  Please see the history of present illness.  All other systems reviewed and negative.   PHYSICAL EXAM: VS:  BP 112/75 mmHg  Pulse 71  Ht 5\' 7"  (1.702 m)  Wt 155 lb 8 oz (70.534 kg)  BMI 24.35 kg/m2 Well nourished, well developed, in no acute distress HEENT: Pupils are equal round react to light accommodation extraocular movements are intact.  Neck: no JVDNo cervical lymphadenopathy. Cardiac: Regular rate and rhythm without murmurs rubs or gallops. Lungs:  clear to auscultation bilaterally,  no wheezing, rhonchi or rales Ext: no lower extremity edema.  2+ radial and dorsalis pedis pulses. Skin: warm and dry Neuro:  Grossly normal   ASSESSMENT AND PLAN:  Problem List Items Addressed This Visit    Essential hypertension    Pressures well-controlled    Nicotine abuse    We discussed how important it is to stop smoking to limit progression of his arterial disease.    PAD (peripheral artery disease) - Primary    Perform follow-up arterial Dopplers in 6 months. Follow-up with Dr. Allyson SabalBerry in 2-3    Relevant Orders      Lower Extremity Arterial Duplex Bilateral

## 2014-11-06 NOTE — Assessment & Plan Note (Signed)
Pressures well-controlled. 

## 2014-11-06 NOTE — Patient Instructions (Signed)
Your physician wants you to follow-up in: 2-3 Months with Dr San MorelleBerry You will receive a reminder letter in the mail two months in advance. If you don't receive a letter, please call our office to schedule the follow-up appointment.  Your physician has requested that you have a lower or upper extremity arterial duplex. This test is an ultrasound of the arteries in the legs or arms. It looks at arterial blood flow in the legs and arms. Allow one hour for Lower and Upper Arterial scans. There are no restrictions or special instructions 6 Months

## 2014-11-06 NOTE — Progress Notes (Signed)
Lower Extremity Arterial Duplex Completed. °Brianna L Mazza,RVT °

## 2014-11-06 NOTE — Assessment & Plan Note (Signed)
We discussed how important it is to stop smoking to limit progression of his arterial disease.

## 2014-11-26 ENCOUNTER — Ambulatory Visit (AMBULATORY_SURGERY_CENTER): Payer: BLUE CROSS/BLUE SHIELD | Admitting: *Deleted

## 2014-11-26 VITALS — Ht 66.0 in | Wt 158.0 lb

## 2014-11-26 DIAGNOSIS — Z86010 Personal history of colonic polyps: Secondary | ICD-10-CM

## 2014-11-26 MED ORDER — MOVIPREP 100 G PO SOLR: 1.0000 | Freq: Once | ORAL | Status: DC

## 2014-11-26 NOTE — Progress Notes (Signed)
Denies allergies to eggs or soy products. Denies complications with sedation or anesthesia. Denies O2 use. Denies use of diet or weight loss medications.  Emmi instructions given for colonoscopy.  

## 2014-12-03 ENCOUNTER — Ambulatory Visit (AMBULATORY_SURGERY_CENTER): Payer: BLUE CROSS/BLUE SHIELD | Admitting: Internal Medicine

## 2014-12-03 ENCOUNTER — Encounter: Payer: Self-pay | Admitting: Internal Medicine

## 2014-12-03 VITALS — BP 113/71 | HR 54 | Temp 95.9°F | Resp 15 | Ht 66.0 in | Wt 158.0 lb

## 2014-12-03 DIAGNOSIS — Z86010 Personal history of colonic polyps: Secondary | ICD-10-CM

## 2014-12-03 HISTORY — PX: COLONOSCOPY: SHX174

## 2014-12-03 MED ORDER — SODIUM CHLORIDE 0.9 % IV SOLN: 500.0000 mL | INTRAVENOUS | Status: DC

## 2014-12-03 NOTE — Progress Notes (Signed)
A/ox3 pleased with MAC, report to Suzanne RN 

## 2014-12-03 NOTE — Op Note (Signed)
[facility] [address] [place], [id number]   COLONOSCOPY PROCEDURE REPORT  PATIENT: Deveron FurlongCampbell, Rafik B  MR#: [id number] BIRTHDATE: 04-27-1956 , 58  yrs. old GENDER: male ENDOSCOPIST: Roxy CedarJohn N Ji Feldner Jr, MD REFERRED XB:[id number]:Surveillance Program Recall PROCEDURE DATE:  12/03/2014 PROCEDURE:   Colonoscopy, surveillance First Screening Colonoscopy - Avg.  risk and is 50 yrs.  old or older - No.  Prior Negative Screening - Now for repeat screening. N/A  History of Adenoma - Now for follow-up colonoscopy & has been > or = to 3 yrs.  N/A  Polyps Removed Today? No.  Polyps Removed Today? No.  Recommend repeat exam, <10 yrs? No. ASA CLASS:   Class II INDICATIONS:surveillance colonoscopy based on a history of colonic polyp(s). Index (DRP) 02-2009 w/ HPP and hamartoma. MEDICATIONS: Monitored anesthesia care and Propofol 200 mg IV  DESCRIPTION OF PROCEDURE:   After the risks benefits and alternatives of the procedure were thoroughly explained, informed consent was obtained.  The digital rectal exam revealed no abnormalities of the rectum.   The LB [id number]  endoscope was introduced through the anus and advanced to the cecum, which was identified by both the appendix and ileocecal valve. No adverse events experienced.   The quality of the prep was excellent, using MoviPrep  The instrument was then slowly withdrawn as the colon was fully examined.     COLON FINDINGS: There was moderate diverticulosis noted throughout the entire examined colon.   The examination was otherwise normal. Retroflexed views revealed internal hemorrhoids. The time to cecum=3 minutes 08 seconds.  Withdrawal time=8 minutes 07 seconds. The scope was withdrawn and the procedure completed.  COMPLICATIONS: There were no immediate complications.  ENDOSCOPIC IMPRESSION: 1.   Moderate diverticulosis was noted throughout the entire examined colon 2.   The examination was otherwise  normal  RECOMMENDATIONS: 1. Continue current colorectal screening recommendations for "routine risk" patients with a repeat colonoscopy in 10 years.  eSigned:  Roxy CedarJohn N Shirah Roseman Jr, MD 12/03/2014 8:35 AM   cc: Nelwyn SalisburyStephen A Fry, MD and The Patient

## 2014-12-03 NOTE — Patient Instructions (Addendum)
YOU HAD AN ENDOSCOPIC PROCEDURE TODAY AT THE [facility]: Refer to the procedure report that was given to you for any specific questions about what was found during the examination.  If the procedure report does not answer your questions, please call your gastroenterologist to clarify.  If you requested that your care partner not be given the details of your procedure findings, then the procedure report has been included in a sealed envelope for you to review at your convenience later.  YOU SHOULD EXPECT: Some feelings of bloating in the abdomen. Passage of more gas than usual.  Walking can help get rid of the air that was put into your GI tract during the procedure and reduce the bloating. If you had a lower endoscopy (such as a colonoscopy or flexible sigmoidoscopy) you may notice spotting of blood in your stool or on the toilet paper. If you underwent a bowel prep for your procedure, then you may not have a normal bowel movement for a few days.  DIET: Your first meal following the procedure should be a light meal and then it is ok to progress to your normal diet.  A half-sandwich or bowl of soup is an example of a good first meal.  Heavy or fried foods are harder to digest and may make you feel nauseous or bloated.  Likewise meals heavy in dairy and vegetables can cause extra gas to form and this can also increase the bloating.  Drink plenty of fluids but you should avoid alcoholic beverages for 24 hours.Try to increase the fiber in your diet due to the Diverticulosis.  ACTIVITY: Your care partner should take you home directly after the procedure.  You should plan to take it easy, moving slowly for the rest of the day.  You can resume normal activity the day after the procedure however you should NOT DRIVE or use heavy machinery for 24 hours (because of the sedation medicines used during the test).    SYMPTOMS TO REPORT IMMEDIATELY: A gastroenterologist can be reached at any hour.  During  normal business hours, 8:30 AM to 5:00 PM Monday through Friday, call [phone].  After hours and on weekends, please call the GI answering service at [phone] who will take a message and have the physician on call contact you.   Following lower endoscopy (colonoscopy or flexible sigmoidoscopy):  Excessive amounts of blood in the stool  Significant tenderness or worsening of abdominal pains  Swelling of the abdomen that is new, acute  Fever of 100F or higher  FOLLOW UP: If any biopsies were taken you will be contacted by phone or by letter within the next 1-3 weeks.  Call your gastroenterologist if you have not heard about the biopsies in 3 weeks.  Our staff will call the home number listed on your records the next business day following your procedure to check on you and address any questions or concerns that you may have at that time regarding the information given to you following your procedure. This is a courtesy call and so if there is no answer at the home number and we have not heard from you through the emergency physician on call, we will assume that you have returned to your regular daily activities without incident.  SIGNATURES/CONFIDENTIALITY: You and/or your care partner have signed paperwork which will be entered into your electronic medical record.  These signatures attest to the fact that that the information above on your After Visit Summary has been reviewed   and is understood.  Full responsibility of the confidentiality of this discharge information lies with you and/or your care-partner.  Read all of the handouts given to you by your recovery room nurse. 

## 2014-12-04 ENCOUNTER — Telehealth: Payer: Self-pay

## 2014-12-04 NOTE — Telephone Encounter (Signed)
Left message on answering machine. 

## 2015-01-10 ENCOUNTER — Telehealth: Payer: Self-pay | Admitting: Family Medicine

## 2015-01-10 MED ORDER — PANTOPRAZOLE SODIUM 20 MG PO TBEC: 20.0000 mg | DELAYED_RELEASE_TABLET | Freq: Every day | ORAL | Status: DC

## 2015-01-10 MED ORDER — LOSARTAN POTASSIUM 100 MG PO TABS: 50.0000 mg | ORAL_TABLET | Freq: Two times a day (BID) | ORAL | Status: DC

## 2015-01-10 NOTE — Telephone Encounter (Signed)
Pt would like to have new rx losartan 100 mg #90 and pantoprazole 20 mg #90 with refills sent to [facility]

## 2015-01-10 NOTE — Telephone Encounter (Signed)
Rx sent to pharmacy   

## 2015-02-07 ENCOUNTER — Telehealth: Payer: Self-pay | Admitting: Cardiovascular Disease

## 2015-02-11 NOTE — Telephone Encounter (Signed)
Closed encounter °

## 2015-02-12 ENCOUNTER — Ambulatory Visit: Payer: BC Managed Care – PPO | Admitting: Cardiovascular Disease

## 2015-03-15 ENCOUNTER — Ambulatory Visit: Payer: BLUE CROSS/BLUE SHIELD | Admitting: Cardiovascular Disease

## 2015-03-29 ENCOUNTER — Encounter: Payer: Self-pay | Admitting: Family Medicine

## 2015-03-29 ENCOUNTER — Ambulatory Visit (INDEPENDENT_AMBULATORY_CARE_PROVIDER_SITE_OTHER): Payer: BLUE CROSS/BLUE SHIELD | Admitting: Family Medicine

## 2015-03-29 VITALS — BP 155/89 | HR 59 | Temp 98.1°F | Ht 66.0 in | Wt 154.0 lb

## 2015-03-29 DIAGNOSIS — I10 Essential (primary) hypertension: Secondary | ICD-10-CM

## 2015-03-29 DIAGNOSIS — R0789 Other chest pain: Secondary | ICD-10-CM | POA: Diagnosis not present

## 2015-03-29 DIAGNOSIS — I739 Peripheral vascular disease, unspecified: Secondary | ICD-10-CM

## 2015-03-29 NOTE — Progress Notes (Signed)
   Subjective:    Patient ID: Adam FurlongJeffrey B Jernberg, male    DOB: [birth year], 59 y.o.   MRN: [id number]  HPI Here to follow up. He feels well. He had a cardiac cath per Dr. Allyson SabalBerry earlier in the year showing clean coronaries, and an ECHO was normal. His BP is stable at home, usually in the range of 120s over 70s. No more chest pains. He did show aortoiliac disease on an arterial doppler and a 6 month follow up was planned.    Review of Systems  Constitutional: Negative.   Respiratory: Negative.   Cardiovascular: Negative.        Objective:   Physical Exam  Constitutional: He appears well-developed and well-nourished.  Neck: No thyromegaly present.  Cardiovascular: Normal rate, regular rhythm, normal heart sounds and intact distal pulses.   Pulmonary/Chest: Effort normal and breath sounds normal.  Lymphadenopathy:    He has no cervical adenopathy.          Assessment & Plan:  He is doing well. His HTN is well controlled. He will see Dr. Allyson SabalBerry sometime soon.

## 2015-03-29 NOTE — Progress Notes (Signed)
Pre visit review using our clinic review tool, if applicable. No additional management support is needed unless otherwise documented below in the visit note. 

## 2015-04-15 ENCOUNTER — Telehealth: Payer: Self-pay | Admitting: Family Medicine

## 2015-04-15 NOTE — Telephone Encounter (Signed)
Call in Flexeril 10 mg tid prn spasms, #90 with 2 rf

## 2015-04-15 NOTE — Telephone Encounter (Signed)
Pt was last seen on 5-6 and pt is having back pain and would like a muscle relaxing call into  [facility]. Pt can not get off work to come to an appt

## 2015-04-16 MED ORDER — CYCLOBENZAPRINE HCL 10 MG PO TABS
10.0000 mg | ORAL_TABLET | Freq: Three times a day (TID) | ORAL | Status: DC | PRN
Start: 2015-04-16 — End: 2017-04-09

## 2015-04-16 NOTE — Telephone Encounter (Signed)
I sent script e-scribe and spoke with pt. 

## 2015-06-05 ENCOUNTER — Encounter: Payer: Self-pay | Admitting: Gastroenterology

## 2015-07-11 ENCOUNTER — Other Ambulatory Visit: Payer: Self-pay | Admitting: Family Medicine

## 2015-09-27 ENCOUNTER — Other Ambulatory Visit (INDEPENDENT_AMBULATORY_CARE_PROVIDER_SITE_OTHER): Payer: BLUE CROSS/BLUE SHIELD

## 2015-09-27 DIAGNOSIS — Z0000 Encounter for general adult medical examination without abnormal findings: Secondary | ICD-10-CM | POA: Diagnosis not present

## 2015-09-27 LAB — POCT URINALYSIS DIPSTICK
BILIRUBIN UA: NEGATIVE
Blood, UA: NEGATIVE
Glucose, UA: NEGATIVE
KETONES UA: NEGATIVE
LEUKOCYTES UA: NEGATIVE
Nitrite, UA: NEGATIVE
PH UA: 7
Protein, UA: NEGATIVE
Spec Grav, UA: 1.015
Urobilinogen, UA: 2

## 2015-09-27 LAB — CBC WITH DIFFERENTIAL/PLATELET
Basophils Absolute: 0 10*3/uL (ref 0.0–0.1)
Basophils Relative: 0.4 % (ref 0.0–3.0)
EOS PCT: 4.7 % (ref 0.0–5.0)
Eosinophils Absolute: 0.3 10*3/uL (ref 0.0–0.7)
HCT: 43.2 % (ref 39.0–52.0)
HEMOGLOBIN: 14.6 g/dL (ref 13.0–17.0)
Lymphocytes Relative: 21.3 % (ref 12.0–46.0)
Lymphs Abs: 1.3 10*3/uL (ref 0.7–4.0)
MCHC: 33.8 g/dL (ref 30.0–36.0)
MCV: 97.3 fl (ref 78.0–100.0)
MONO ABS: 0.5 10*3/uL (ref 0.1–1.0)
Monocytes Relative: 8.8 % (ref 3.0–12.0)
NEUTROS ABS: 4 10*3/uL (ref 1.4–7.7)
Neutrophils Relative %: 64.8 % (ref 43.0–77.0)
PLATELETS: 263 10*3/uL (ref 150.0–400.0)
RBC: 4.43 Mil/uL (ref 4.22–5.81)
RDW: 13.2 % (ref 11.5–15.5)
WBC: 6.2 10*3/uL (ref 4.0–10.5)

## 2015-09-27 LAB — HEPATIC FUNCTION PANEL
ALT: 14 U/L (ref 0–53)
AST: 18 U/L (ref 0–37)
Albumin: 3.9 g/dL (ref 3.5–5.2)
Alkaline Phosphatase: 79 U/L (ref 39–117)
BILIRUBIN TOTAL: 0.8 mg/dL (ref 0.2–1.2)
Bilirubin, Direct: 0.2 mg/dL (ref 0.0–0.3)
TOTAL PROTEIN: 5.8 g/dL — AB (ref 6.0–8.3)

## 2015-09-27 LAB — LIPID PANEL
CHOLESTEROL: 136 mg/dL (ref 0–200)
HDL: 38.9 mg/dL — ABNORMAL LOW (ref >39.00)
LDL CALC: 88 mg/dL (ref 0–99)
NonHDL: 96.97
TRIGLYCERIDES: 46 mg/dL (ref 0.0–149.0)
Total CHOL/HDL Ratio: 3
VLDL: 9.2 mg/dL (ref 0.0–40.0)

## 2015-09-27 LAB — BASIC METABOLIC PANEL
BUN: 11 mg/dL (ref 6–23)
CHLORIDE: 97 meq/L (ref 96–112)
CO2: 31 meq/L (ref 19–32)
Calcium: 9.2 mg/dL (ref 8.4–10.5)
Creatinine, Ser: 0.87 mg/dL (ref 0.40–1.50)
GFR: 95.73 mL/min (ref >60.00)
GLUCOSE: 99 mg/dL (ref 70–99)
Potassium: 5.1 mEq/L (ref 3.5–5.1)
Sodium: 131 mEq/L — ABNORMAL LOW (ref 135–145)

## 2015-09-27 LAB — TSH: TSH: 0.72 u[IU]/mL (ref 0.35–4.50)

## 2015-09-27 LAB — PSA: PSA: 0.32 ng/mL (ref 0.10–4.00)

## 2015-10-04 ENCOUNTER — Encounter: Payer: Self-pay | Admitting: Family Medicine

## 2015-10-04 ENCOUNTER — Ambulatory Visit (INDEPENDENT_AMBULATORY_CARE_PROVIDER_SITE_OTHER): Payer: BLUE CROSS/BLUE SHIELD | Admitting: Family Medicine

## 2015-10-04 VITALS — BP 157/93 | HR 59 | Temp 98.6°F | Ht 66.0 in | Wt 155.0 lb

## 2015-10-04 DIAGNOSIS — I10 Essential (primary) hypertension: Secondary | ICD-10-CM

## 2015-10-04 DIAGNOSIS — Z23 Encounter for immunization: Secondary | ICD-10-CM

## 2015-10-04 DIAGNOSIS — Z0000 Encounter for general adult medical examination without abnormal findings: Secondary | ICD-10-CM

## 2015-10-04 MED ORDER — LOSARTAN POTASSIUM 100 MG PO TABS: ORAL_TABLET | ORAL | Status: DC

## 2015-10-04 MED ORDER — AMLODIPINE BESYLATE 10 MG PO TABS: 10.0000 mg | ORAL_TABLET | Freq: Every day | ORAL | Status: DC

## 2015-10-04 MED ORDER — CLONIDINE HCL 0.2 MG PO TABS: ORAL_TABLET | ORAL | Status: DC

## 2015-10-04 MED ORDER — PANTOPRAZOLE SODIUM 20 MG PO TBEC: 20.0000 mg | DELAYED_RELEASE_TABLET | Freq: Every day | ORAL | Status: DC

## 2015-10-04 MED ORDER — METOPROLOL SUCCINATE ER 200 MG PO TB24: 200.0000 mg | ORAL_TABLET | Freq: Every day | ORAL | Status: DC

## 2015-10-04 NOTE — Progress Notes (Signed)
Pre visit review using our clinic review tool, if applicable. No additional management support is needed unless otherwise documented below in the visit note. 

## 2015-10-04 NOTE — Progress Notes (Signed)
   Subjective:    Patient ID: [name], male    DOB: 1956/09/09, 59 y.o.   MRN: [id number]  HPI 59 yr old male for a cpx. He feels well. His BP is stable at home.    Review of Systems  Constitutional: Negative.   HENT: Negative.   Eyes: Negative.   Respiratory: Negative.   Cardiovascular: Negative.   Gastrointestinal: Negative.   Genitourinary: Negative.   Musculoskeletal: Negative.   Skin: Negative.   Neurological: Negative.   Psychiatric/Behavioral: Negative.        Objective:   Physical Exam  Constitutional: He is oriented to person, place, and time. He appears well-developed and well-nourished. No distress.  HENT:  Head: Normocephalic and atraumatic.  Right Ear: External ear normal.  Left Ear: External ear normal.  Nose: Nose normal.  Mouth/Throat: Oropharynx is clear and moist. No oropharyngeal exudate.  Eyes: Conjunctivae and EOM are normal. Pupils are equal, round, and reactive to light. Right eye exhibits no discharge. Left eye exhibits no discharge. No scleral icterus.  Neck: Neck supple. No JVD present. No tracheal deviation present. No thyromegaly present.  Cardiovascular: Normal rate, regular rhythm, normal heart sounds and intact distal pulses.  Exam reveals no gallop and no friction rub.   No murmur heard. EKG normal   Pulmonary/Chest: Effort normal and breath sounds normal. No respiratory distress. He has no wheezes. He has no rales. He exhibits no tenderness.  Abdominal: Soft. Bowel sounds are normal. He exhibits no distension and no mass. There is no tenderness. There is no rebound and no guarding.  Genitourinary: Rectum normal, prostate normal and penis normal. Guaiac negative stool. No penile tenderness.  Musculoskeletal: Normal range of motion. He exhibits no edema or tenderness.  Lymphadenopathy:    He has no cervical adenopathy.  Neurological: He is alert and oriented to person, place, and time. He has normal reflexes. No cranial nerve  deficit. He exhibits normal muscle tone. Coordination normal.  Skin: Skin is warm and dry. No rash noted. He is not diaphoretic. No erythema. No pallor.  Psychiatric: He has a normal mood and affect. His behavior is normal. Judgment and thought content normal.          Assessment & Plan:  Well exam. We discussed diet and exercise.

## 2016-04-03 ENCOUNTER — Encounter: Payer: Self-pay | Admitting: Family Medicine

## 2016-04-03 ENCOUNTER — Ambulatory Visit (INDEPENDENT_AMBULATORY_CARE_PROVIDER_SITE_OTHER): Payer: BLUE CROSS/BLUE SHIELD | Admitting: Family Medicine

## 2016-04-03 VITALS — BP 163/95 | HR 62 | Temp 98.4°F | Ht 66.0 in | Wt 150.0 lb

## 2016-04-03 DIAGNOSIS — I10 Essential (primary) hypertension: Secondary | ICD-10-CM | POA: Diagnosis not present

## 2016-04-03 DIAGNOSIS — K219 Gastro-esophageal reflux disease without esophagitis: Secondary | ICD-10-CM

## 2016-04-03 NOTE — Progress Notes (Signed)
   Subjective:    Patient ID: [name], male    DOB: 1956-08-03, 60 y.o.   MRN: [id number]  HPI Here to follow up on HTN and GERD. He feels fine. His BP at the pharmacy is stable in the 110s to 130s systolic.    Review of Systems  Constitutional: Negative.   Respiratory: Negative.   Cardiovascular: Negative.   Gastrointestinal: Negative.   Neurological: Negative.        Objective:   Physical Exam  Constitutional: He is oriented to person, place, and time. He appears well-developed and well-nourished.  Neck: No thyromegaly present.  Cardiovascular: Normal rate, regular rhythm, normal heart sounds and intact distal pulses.   Pulmonary/Chest: Effort normal and breath sounds normal.  Lymphadenopathy:    He has no cervical adenopathy.  Neurological: He is alert and oriented to person, place, and time.          Assessment & Plan:  HTN and GERD are well controlled.  Nelwyn SalisburyFRY,Mattalynn Crandle A, MD

## 2016-04-03 NOTE — Progress Notes (Signed)
Pre visit review using our clinic review tool, if applicable. No additional management support is needed unless otherwise documented below in the visit note. 

## 2016-09-25 ENCOUNTER — Other Ambulatory Visit (INDEPENDENT_AMBULATORY_CARE_PROVIDER_SITE_OTHER): Payer: BLUE CROSS/BLUE SHIELD

## 2016-09-25 DIAGNOSIS — Z0000 Encounter for general adult medical examination without abnormal findings: Secondary | ICD-10-CM

## 2016-09-25 LAB — LIPID PANEL
CHOL/HDL RATIO: 3
Cholesterol: 124 mg/dL (ref 0–200)
HDL: 43.6 mg/dL (ref >39.00)
LDL CALC: 71 mg/dL (ref 0–99)
NONHDL: 80.69
TRIGLYCERIDES: 49 mg/dL (ref 0.0–149.0)
VLDL: 9.8 mg/dL (ref 0.0–40.0)

## 2016-09-25 LAB — POC URINALSYSI DIPSTICK (AUTOMATED)
Bilirubin, UA: NEGATIVE
Glucose, UA: NEGATIVE
Ketones, UA: NEGATIVE
Leukocytes, UA: NEGATIVE
Nitrite, UA: NEGATIVE
PH UA: 7
Protein, UA: NEGATIVE
RBC UA: NEGATIVE
SPEC GRAV UA: 1.01
UROBILINOGEN UA: 1

## 2016-09-25 LAB — HEPATIC FUNCTION PANEL
ALK PHOS: 72 U/L (ref 39–117)
ALT: 17 U/L (ref 0–53)
AST: 17 U/L (ref 0–37)
Albumin: 4 g/dL (ref 3.5–5.2)
BILIRUBIN DIRECT: 0.1 mg/dL (ref 0.0–0.3)
BILIRUBIN TOTAL: 0.4 mg/dL (ref 0.2–1.2)
TOTAL PROTEIN: 5.8 g/dL — AB (ref 6.0–8.3)

## 2016-09-25 LAB — CBC WITH DIFFERENTIAL/PLATELET
BASOS ABS: 0 10*3/uL (ref 0.0–0.1)
Basophils Relative: 0.5 % (ref 0.0–3.0)
EOS ABS: 0.3 10*3/uL (ref 0.0–0.7)
Eosinophils Relative: 5.5 % — ABNORMAL HIGH (ref 0.0–5.0)
HEMATOCRIT: 40.3 % (ref 39.0–52.0)
HEMOGLOBIN: 13.8 g/dL (ref 13.0–17.0)
LYMPHS PCT: 29.1 % (ref 12.0–46.0)
Lymphs Abs: 1.4 10*3/uL (ref 0.7–4.0)
MCHC: 34.3 g/dL (ref 30.0–36.0)
MCV: 97.9 fl (ref 78.0–100.0)
MONO ABS: 0.5 10*3/uL (ref 0.1–1.0)
Monocytes Relative: 10 % (ref 3.0–12.0)
Neutro Abs: 2.6 10*3/uL (ref 1.4–7.7)
Neutrophils Relative %: 54.9 % (ref 43.0–77.0)
Platelets: 298 10*3/uL (ref 150.0–400.0)
RBC: 4.11 Mil/uL — AB (ref 4.22–5.81)
RDW: 13.6 % (ref 11.5–15.5)
WBC: 4.8 10*3/uL (ref 4.0–10.5)

## 2016-09-25 LAB — BASIC METABOLIC PANEL
BUN: 8 mg/dL (ref 6–23)
CHLORIDE: 92 meq/L — AB (ref 96–112)
CO2: 30 mEq/L (ref 19–32)
CREATININE: 0.91 mg/dL (ref 0.40–1.50)
Calcium: 9.1 mg/dL (ref 8.4–10.5)
GFR: 90.58 mL/min (ref >60.00)
Glucose, Bld: 96 mg/dL (ref 70–99)
Potassium: 4.7 mEq/L (ref 3.5–5.1)
Sodium: 126 mEq/L — ABNORMAL LOW (ref 135–145)

## 2016-09-25 LAB — TSH: TSH: 0.8 u[IU]/mL (ref 0.35–4.50)

## 2016-09-25 LAB — PSA: PSA: 0.28 ng/mL (ref 0.10–4.00)

## 2016-10-02 ENCOUNTER — Encounter: Payer: BLUE CROSS/BLUE SHIELD | Admitting: Family Medicine

## 2016-10-05 ENCOUNTER — Telehealth: Payer: Self-pay | Admitting: Family Medicine

## 2016-10-05 ENCOUNTER — Encounter: Payer: BLUE CROSS/BLUE SHIELD | Admitting: Family Medicine

## 2016-10-05 ENCOUNTER — Encounter: Payer: Self-pay | Admitting: Family Medicine

## 2016-10-05 ENCOUNTER — Ambulatory Visit (INDEPENDENT_AMBULATORY_CARE_PROVIDER_SITE_OTHER): Payer: BLUE CROSS/BLUE SHIELD | Admitting: Family Medicine

## 2016-10-05 VITALS — BP 146/90 | HR 80 | Temp 97.9°F | Ht 66.0 in | Wt 153.8 lb

## 2016-10-05 DIAGNOSIS — Z0001 Encounter for general adult medical examination with abnormal findings: Secondary | ICD-10-CM

## 2016-10-05 DIAGNOSIS — K219 Gastro-esophageal reflux disease without esophagitis: Secondary | ICD-10-CM

## 2016-10-05 DIAGNOSIS — Z23 Encounter for immunization: Secondary | ICD-10-CM

## 2016-10-05 DIAGNOSIS — I10 Essential (primary) hypertension: Secondary | ICD-10-CM

## 2016-10-05 DIAGNOSIS — E871 Hypo-osmolality and hyponatremia: Secondary | ICD-10-CM | POA: Diagnosis not present

## 2016-10-05 MED ORDER — CLONIDINE HCL 0.2 MG PO TABS: ORAL_TABLET | ORAL | 3 refills | Status: DC

## 2016-10-05 MED ORDER — OMEPRAZOLE 20 MG PO CPDR: 20.0000 mg | DELAYED_RELEASE_CAPSULE | Freq: Every day | ORAL | 3 refills | Status: DC

## 2016-10-05 MED ORDER — METOPROLOL SUCCINATE ER 200 MG PO TB24: 200.0000 mg | ORAL_TABLET | Freq: Every day | ORAL | 3 refills | Status: DC

## 2016-10-05 MED ORDER — LOSARTAN POTASSIUM 100 MG PO TABS: ORAL_TABLET | ORAL | 3 refills | Status: DC

## 2016-10-05 MED ORDER — AMLODIPINE BESYLATE 10 MG PO TABS: 10.0000 mg | ORAL_TABLET | Freq: Every day | ORAL | 3 refills | Status: DC

## 2016-10-05 NOTE — Progress Notes (Signed)
Pre visit review using our clinic review tool, if applicable. No additional management support is needed unless otherwise documented below in the visit note. 

## 2016-10-05 NOTE — Progress Notes (Signed)
Subjective:    Patient ID: [name], male    DOB: [birth year], 60 y.o.   MRN: [id number]  HPI  Mr. Merkey is a 60 year old male is here today for a routine physical.  Hypertension:  Monitors BP at home and he reports systolic averages in the 120s and diastolic in the 80s. He reports checking BP this morning with a reading of 135/81 just after taking his medications this morning. Denies chest pain, palpitations, SOB, headaches, nosebleeds, fatigue, dyspnea, intermittent claudication, numbness, tingling, or weakness  Colonoscopy is UTD.   Influenza vaccine is needed and he will receive this today.  Lab results reviewed with hyponatremia present that has been chronic for this patient. He is not taking a diuretic. He is currently on pantoprazole which potentially may be contributing to low Na level. He denies fatigue, N/V, dizziness, muscle cramps, myalgias, or gait disturbances.  He does not follow a particular diet and he is a current smoker with a 35 pack year history.  Review of Systems Constitutional: No fever, chills, significant weight change, fatigue, weakness or night sweats Eyes: No redness, discharge, pain, blurred vision, double vision, or loss of vision ENT/mouth: No nasal congestion, postnasal drainage,epistaxis, purulent discharge, earache, hearing loss, tinnitus ,sore throat  or hoarseness. He has upper and lower dentures   Cardiovascular: no chest pain, palpitations, racing, irregular rhythm, syncope, nausea, sweating, claudication, or edema  Respiratory: No cough, sputum production,hemoptysis,  dyspnea, paroxysmal nocturnal dyspnea, pleuritic chest pain, significant snoring, or  apnea    Gastrointestinal: No heartburn,dysphagia, nausea and vomiting,ominal pain, change in bowels, anorexia, diarrhea, significant constipation, rectal bleeding, melena,  stool incontinence or jaundice Genitourinary: No dysuria,hematuria, pyuria, frequency, urgency,  incontinence,  nocturia, dark urine or flank pain Musculoskeletal: No myalgias or muscle cramping, joint stiffness, joint swelling, joint color change, weakness, or cyanosis Dermatologic: No rash, pruritus, urticaria, or change in color or temperature of skin Neurologic: No headache, vertigo, limb weakness, tremor, gait disturbance, seizures, memory loss, numbness or tingling Psychiatric: No significant anxiety or depression, anhedonia, panic attacks, insomnia, or anorexia Endocrine: No change in hair/skin/ nails, excessive thirst, excessive hunger, excessive urination, or unexplained fatigue Hematologic/lymphatic: No bruising, lymphadenopathy,or  abnormal clotting Allergy/immunology: No itchy/ watery eyes, abnormal sneezing, rhinitis, urticaria ,or angioedema   Past Medical History:  Diagnosis Date  . Concussion 08-2007  . Erectile dysfunction   . Fracture of left hand   . Hypertension      Social History   Social History  . Marital status: Married    Spouse name: N/A  . Number of children: N/A  . Years of education: N/A   Occupational History  . truck driver    Social History Main Topics  . Smoking status: Current Every Day Smoker    Packs/day: 1.00    Years: 35.00    Types: Cigarettes  . Smokeless tobacco: Never Used     Comment: 1/2 pack per day  . Alcohol use 1.8 oz/week    3 Cans of beer per week     Comment: occ a beer  . Drug use: No  . Sexual activity: Not on file   Other Topics Concern  . Not on file   Social History Narrative  . No narrative on file    Past Surgical History:  Procedure Laterality Date  . COLONOSCOPY  12-03-14   per Dr. Marina Goodell, diverticulosis only, repeat in 10 yrs  . LEFT HEART CATHETERIZATION WITH CORONARY ANGIOGRAM N/A 10/22/2014   Procedure: LEFT  HEART CATHETERIZATION WITH CORONARY ANGIOGRAM;  Surgeon: Runell GessJonathan J Berry, MD;  [facility];  Service: Cardiovascular;  Laterality: N/A;    Family History  Problem Relation Age of Onset  .  Arthritis    . Coronary artery disease    . Hypertension    . Colon cancer Neg Hx   . Esophageal cancer Neg Hx   . Rectal cancer Neg Hx   . Stomach cancer Neg Hx     Allergies  Allergen Reactions  . Erythromycin     REACTION: Raw throat    Current Outpatient Prescriptions on File Prior to Visit  Medication Sig Dispense Refill  . aspirin 81 MG chewable tablet Chew 1 tablet (81 mg total) by mouth daily. 30 tablet 3  . Fish Oil OIL Take by mouth 2 (two) times daily.    . pantoprazole (PROTONIX) 20 MG tablet Take 1 tablet (20 mg total) by mouth daily. 90 tablet 3  . cyclobenzaprine (FLEXERIL) 10 MG tablet Take 1 tablet (10 mg total) by mouth 3 (three) times daily as needed for muscle spasms. (Patient not taking: Reported on 10/05/2016) 90 tablet 2   No current facility-administered medications on file prior to visit.     BP (!) 146/90   Pulse 80   Temp 97.9 F (36.6 C) (Oral)   Ht 5\' 6"  (1.676 m)   Wt 153 lb 12.8 oz (69.8 kg)   SpO2 98%   BMI 24.82 kg/m       Objective:   Physical Exam Physical Exam  Constitutional: He is oriented to person, place, and time. He appears well-developed and well-nourished. No distress.  HENT:  Head: Normocephalic and atraumatic.  Right Ear: Tympanic membrane and ear canal normal.  Left Ear: Tympanic membrane and ear canal normal.  Mouth/Throat: Oropharynx is clear and moist.  Eyes: Pupils are equal, round, and reactive to light. No scleral icterus.  Neck: Normal range of motion. No thyromegaly present.  Cardiovascular: Normal rate and regular rhythm.   No murmur heard. Pulmonary/Chest: Effort normal and breath sounds normal. No respiratory distress. He has no wheezes. He has no rales. He exhibits no tenderness.  Abdominal: Soft. Bowel sounds are normal. He exhibits no distension and no mass. There is no tenderness. There is no rebound and no guarding.  Musculoskeletal: He exhibits no edema.  Lymphadenopathy:    He has no cervical  adenopathy.  Neurological: He is alert and oriented to person, place, and time. He has normal reflexes. He exhibits normal muscle tone. Coordination normal.  Skin: Skin is warm and dry.  Psychiatric: He has a normal mood and affect. His behavior is normal. Judgment and thought content normal.         Assessment & Plan:  1. Encounter for general adult medical examination with abnormal findings 60 y.o. male presenting for annual physical.  Health Maintenance counseling: 1. Anticipatory guidance: Patient counseled regarding regular dental exams, eye exams, wearing seatbelts. Discussed tobacco cessation and he declines interest in stopping at this time. 2. Risk factor reduction:  Advised patient of need for regular exercise and diet rich and fruits and vegetables to reduce risk of heart attack and stroke.  3. Immunizations/screenings/ancillary studies Immunization History  Administered Date(s) Administered  . Influenza,inj,Quad PF,36+ Mos 10/22/2014, 10/04/2015, 10/05/2016   Health Maintenance Due  Topic Date Due  . Hepatitis C Screening  1956/09/05  . HIV Screening  07/22/1972  . TETANUS/TDAP  07/22/1976   4. Prostate cancer screening- Review of PSA today;  Asymptomatic; PSA level declining.  Lab Results  Component Value Date   PSA 0.28 09/25/2016   PSA 0.32 09/27/2015   PSA 0.35 09/21/2014   5. Colon cancer screening - UTD; Due in 2026 6. Skin cancer screening- No abnormal nevi noted on back, arms, and abdomen present  2. Hyponatremia Hyponatremia has been a chronic issue for this patient. He is asymptomatic and he is not taking a diuretic.  Recommended discontinuing pantoprazole and replace with omeprazole and recheck lab work with BMP in one month.    - CBC with Differential/Platelet; Future - Basic metabolic panel; Future  3. Essential hypertension Controlled; patient reports "white coat syndrome" when coming to medical office. Advised continued regimen; refills provided;  recommended continued monitoring and follow up with Dr. Clent RidgesFry if BP average is >140/90 with treatment.  4. Encounter for immunization  - Flu Vaccine QUAD 36+ mos IM   5. Gastroesophageal reflux disease without esophagitis Discontinue pantoprazole and initiate omeprazole. We discussed avoidance of triggers for GERD including alcohol, tobacco, spicy foods, and NSAIDs. He will continue omeprazole at this time and discuss possible discontinuation of this medication long term with Dr. Clent RidgesFry when he controls triggers for symptoms.   Follow up in one month for evaluation of lab work after trial of omeprazole and discontinuation of pantoprazole.  Also recommended follow up with Dr. Clent RidgesFry as recommended by him.  Patient voiced understanding and agreed with plan. Roddie McJulia Careli Luzader, FNP-C

## 2016-10-05 NOTE — Patient Instructions (Signed)
It was a pleasure to meet you today! Please follow up in one month for a recheck of your lab work. Please discontinue pantoprazole and start omeprazole.   If you wish to proceed with a low dose CT scan for lung evaluation, please contact office.  Health Maintenance, Male A healthy lifestyle and preventative care can promote health and wellness.  Maintain regular health, dental, and eye exams.  Eat a healthy diet. Foods like vegetables, fruits, whole grains, low-fat dairy products, and lean protein foods contain the nutrients you need and are low in calories. Decrease your intake of foods high in solid fats, added sugars, and salt. Get information about a proper diet from your health care provider, if necessary.  Regular physical exercise is one of the most important things you can do for your health. Most adults should get at least 150 minutes of moderate-intensity exercise (any activity that increases your heart rate and causes you to sweat) each week. In addition, most adults need muscle-strengthening exercises on 2 or more days a week.   Maintain a healthy weight. The body mass index (BMI) is a screening tool to identify possible weight problems. It provides an estimate of body fat based on height and weight. Your health care provider can find your BMI and can help you achieve or maintain a healthy weight. For males 20 years and older:  A BMI below 18.5 is considered underweight.  A BMI of 18.5 to 24.9 is normal.  A BMI of 25 to 29.9 is considered overweight.  A BMI of 30 and above is considered obese.  Maintain normal blood lipids and cholesterol by exercising and minimizing your intake of saturated fat. Eat a balanced diet with plenty of fruits and vegetables. Blood tests for lipids and cholesterol should begin at age 59 and be repeated every 5 years. If your lipid or cholesterol levels are high, you are over age 59, or you are at high risk for heart disease, you may need your  cholesterol levels checked more frequently.Ongoing high lipid and cholesterol levels should be treated with medicines if diet and exercise are not working.  If you smoke, find out from your health care provider how to quit. If you do not use tobacco, do not start.  Lung cancer screening is recommended for adults aged 55-80 years who are at high risk for developing lung cancer because of a history of smoking. A yearly low-dose CT scan of the lungs is recommended for people who have at least a 30-pack-year history of smoking and are current smokers or have quit within the past 15 years. A pack year of smoking is smoking an average of 1 pack of cigarettes a day for 1 year (for example, a 30-pack-year history of smoking could mean smoking 1 pack a day for 30 years or 2 packs a day for 15 years). Yearly screening should continue until the smoker has stopped smoking for at least 15 years. Yearly screening should be stopped for people who develop a health problem that would prevent them from having lung cancer treatment.  If you choose to drink alcohol, do not have more than 2 drinks per day. One drink is considered to be 12 oz (360 mL) of beer, 5 oz (150 mL) of wine, or 1.5 oz (45 mL) of liquor.  Avoid the use of street drugs. Do not share needles with anyone. Ask for help if you need support or instructions about stopping the use of drugs.  High blood pressure causes  heart disease and increases the risk of stroke. High blood pressure is more likely to develop in:  People who have blood pressure in the end of the normal range (100-139/85-89 mm Hg).  People who are overweight or obese.  People who are African American.  If you are 51-78 years of age, have your blood pressure checked every 3-5 years. If you are 48 years of age or older, have your blood pressure checked every year. You should have your blood pressure measured twice--once when you are at a hospital or clinic, and once when you are not at a  hospital or clinic. Record the average of the two measurements. To check your blood pressure when you are not at a hospital or clinic, you can use:  An automated blood pressure machine at a pharmacy.  A home blood pressure monitor.  If you are 62-2 years old, ask your health care provider if you should take aspirin to prevent heart disease.  Diabetes screening involves taking a blood sample to check your fasting blood sugar level. This should be done once every 3 years after age 2 if you are at a normal weight and without risk factors for diabetes. Testing should be considered at a younger age or be carried out more frequently if you are overweight and have at least 1 risk factor for diabetes.  Colorectal cancer can be detected and often prevented. Most routine colorectal cancer screening begins at the age of 34 and continues through age 1. However, your health care provider may recommend screening at an earlier age if you have risk factors for colon cancer. On a yearly basis, your health care provider may provide home test kits to check for hidden blood in the stool. A small camera at the end of a tube may be used to directly examine the colon (sigmoidoscopy or colonoscopy) to detect the earliest forms of colorectal cancer. Talk to your health care provider about this at age 60 when routine screening begins. A direct exam of the colon should be repeated every 5-10 years through age 35, unless early forms of precancerous polyps or small growths are found.  People who are at an increased risk for hepatitis B should be screened for this virus. You are considered at high risk for hepatitis B if:  You were born in a country where hepatitis B occurs often. Talk with your health care provider about which countries are considered high risk.  Your parents were born in a high-risk country and you have not received a shot to protect against hepatitis B (hepatitis B vaccine).  You have HIV or AIDS.  You  use needles to inject street drugs.  You live with, or have sex with, someone who has hepatitis B.  You are a man who has sex with other men (MSM).  You get hemodialysis treatment.  You take certain medicines for conditions like cancer, organ transplantation, and autoimmune conditions.  Hepatitis C blood testing is recommended for all people born from [birth year] through 1964 and any individual with known risk factors for hepatitis C.  Healthy men should no longer receive prostate-specific antigen (PSA) blood tests as part of routine cancer screening. Talk to your health care provider about prostate cancer screening.  Testicular cancer screening is not recommended for adolescents or adult males who have no symptoms. Screening includes self-exam, a health care provider exam, and other screening tests. Consult with your health care provider about any symptoms you have or any concerns you have about  testicular cancer.  Practice safe sex. Use condoms and avoid high-risk sexual practices to reduce the spread of sexually transmitted infections (STIs).  You should be screened for STIs, including gonorrhea and chlamydia if:  You are sexually active and are younger than 24 years.  You are older than 24 years, and your health care provider tells you that you are at risk for this type of infection.  Your sexual activity has changed since you were last screened, and you are at an increased risk for chlamydia or gonorrhea. Ask your health care provider if you are at risk.  If you are at risk of being infected with HIV, it is recommended that you take a prescription medicine daily to prevent HIV infection. This is called pre-exposure prophylaxis (PrEP). You are considered at risk if:  You are a man who has sex with other men (MSM).  You are a heterosexual man who is sexually active with multiple partners.  You take drugs by injection.  You are sexually active with a partner who has HIV.  Talk with  your health care provider about whether you are at high risk of being infected with HIV. If you choose to begin PrEP, you should first be tested for HIV. You should then be tested every 3 months for as long as you are taking PrEP.  Use sunscreen. Apply sunscreen liberally and repeatedly throughout the day. You should seek shade when your shadow is shorter than you. Protect yourself by wearing long sleeves, pants, a wide-brimmed hat, and sunglasses year round whenever you are outdoors.  Tell your health care provider of new moles or changes in moles, especially if there is a change in shape or color. Also, tell your health care provider if a mole is larger than the size of a pencil eraser.  A one-time screening for abdominal aortic aneurysm (AAA) and surgical repair of large AAAs by ultrasound is recommended for men aged 34-75 years who are current or former smokers.  Stay current with your vaccines (immunizations).   This information is not intended to replace advice given to you by your health care provider. Make sure you discuss any questions you have with your health care provider.   Document Released: 05/07/2008 Document Revised: 11/30/2014 Document Reviewed: 04/06/2011 Elsevier Interactive Patient Education [employer].

## 2016-10-05 NOTE — Telephone Encounter (Signed)
Pt saw [name] today for his cpe. Pt states his sodium is reported a little low. However pt states this has always been the issue, and it his normal. Before he makes any changes to his med, pt would like to run this by Dr Clent RidgesFry and see if he thiinks a med change from pantoprazole (PROTONIX) 20 MG tablet To  omeprazole (PRILOSEC) 20 MG capsule.  Pt will wait to pick up his omeprazole (PRILOSEC) 20 MG capsule for Dr Clent RidgesFry to look at.  Please advise.  Ok to leave message. Pt will call back.  [facility] [place]

## 2016-10-06 NOTE — Telephone Encounter (Signed)
I agree with the patient, and he can stay on Pantoprazole. DC the Omeprazole order. Refill Pantoprazole #90 with 3 rf

## 2016-10-07 MED ORDER — PANTOPRAZOLE SODIUM 20 MG PO TBEC: 20.0000 mg | DELAYED_RELEASE_TABLET | Freq: Every day | ORAL | 3 refills | Status: DC

## 2016-10-07 NOTE — Telephone Encounter (Signed)
Spoke to pt, told him Dr. Clent RidgesFry agreed with him and can stay on Pantoprazole and stop Omeprazole. Told him Rx sent to pharmacy and I will cancel Rx for Omeprazole. Pt verbalized understanding.   Called pharmacy and spoke to pharmacist and cancelled Rx for Omeprazole.

## 2016-10-12 ENCOUNTER — Encounter: Payer: BLUE CROSS/BLUE SHIELD | Admitting: Family Medicine

## 2017-04-09 ENCOUNTER — Ambulatory Visit (INDEPENDENT_AMBULATORY_CARE_PROVIDER_SITE_OTHER): Payer: BLUE CROSS/BLUE SHIELD | Admitting: Family Medicine

## 2017-04-09 ENCOUNTER — Encounter: Payer: Self-pay | Admitting: Family Medicine

## 2017-04-09 VITALS — BP 139/96 | HR 63 | Temp 98.3°F | Ht 66.0 in | Wt 150.0 lb

## 2017-04-09 DIAGNOSIS — I10 Essential (primary) hypertension: Secondary | ICD-10-CM | POA: Diagnosis not present

## 2017-04-09 MED ORDER — METOPROLOL SUCCINATE ER 200 MG PO TB24: 100.0000 mg | ORAL_TABLET | Freq: Two times a day (BID) | ORAL | 3 refills | Status: DC

## 2017-04-09 MED ORDER — AMLODIPINE BESYLATE 10 MG PO TABS: 5.0000 mg | ORAL_TABLET | Freq: Two times a day (BID) | ORAL | 3 refills | Status: DC

## 2017-04-09 NOTE — Patient Instructions (Signed)
WE NOW OFFER   [name]'s FAST TRACK!!!  SAME DAY Appointments for ACUTE CARE  Such as: Sprains, Injuries, cuts, abrasions, rashes, muscle pain, joint pain, back pain Colds, flu, sore throats, headache, allergies, cough, fever  Ear pain, sinus and eye infections Abdominal pain, nausea, vomiting, diarrhea, upset stomach Animal/insect bites  3 Easy Ways to Schedule: Walk-In Scheduling Call in scheduling Mychart Sign-up: [email or web address]         

## 2017-04-09 NOTE — Progress Notes (Signed)
   Subjective:    Patient ID: Deveron FurlongJeffrey B Barbian, male    DOB: [birth year], 61 y.o.   MRN: [id number]  HPI Here to follow up on HTN. He checks this frequently at home and his readings have been quite erratic. He may get systolic readings of 160 or he may get in the 90s. Sometimes he feels lightheaded when it drops too low.    Review of Systems  Constitutional: Negative.   Respiratory: Negative.   Cardiovascular: Negative.   Neurological: Positive for light-headedness. Negative for dizziness and headaches.       Objective:   Physical Exam  Constitutional: He is oriented to person, place, and time. He appears well-developed and well-nourished.  Neck: No thyromegaly present.  Cardiovascular: Normal rate, regular rhythm, normal heart sounds and intact distal pulses.   Pulmonary/Chest: Effort normal and breath sounds normal. No respiratory distress. He has no wheezes. He has no rales.  Musculoskeletal: He exhibits no edema.  Lymphadenopathy:    He has no cervical adenopathy.  Neurological: He is alert and oriented to person, place, and time.          Assessment & Plan:  His BP has been erratic so we will split all his medications up so that he takes them BID. Hopefully this can balance out his pressures better.  Gershon CraneStephen Warnell Rasnic, MD

## 2017-10-08 ENCOUNTER — Encounter: Payer: Self-pay | Admitting: Family Medicine

## 2017-10-08 ENCOUNTER — Ambulatory Visit (INDEPENDENT_AMBULATORY_CARE_PROVIDER_SITE_OTHER): Payer: BLUE CROSS/BLUE SHIELD | Admitting: Family Medicine

## 2017-10-08 VITALS — BP 140/80 | HR 60 | Temp 98.0°F | Ht 66.0 in | Wt 154.4 lb

## 2017-10-08 DIAGNOSIS — Z23 Encounter for immunization: Secondary | ICD-10-CM

## 2017-10-08 DIAGNOSIS — Z0000 Encounter for general adult medical examination without abnormal findings: Secondary | ICD-10-CM

## 2017-10-08 LAB — CBC WITH DIFFERENTIAL/PLATELET
BASOS ABS: 0 10*3/uL (ref 0.0–0.1)
Basophils Relative: 0.5 % (ref 0.0–3.0)
EOS ABS: 0.2 10*3/uL (ref 0.0–0.7)
Eosinophils Relative: 3.4 % (ref 0.0–5.0)
HCT: 43.4 % (ref 39.0–52.0)
HEMOGLOBIN: 14.5 g/dL (ref 13.0–17.0)
Lymphocytes Relative: 22.8 % (ref 12.0–46.0)
Lymphs Abs: 1.4 10*3/uL (ref 0.7–4.0)
MCHC: 33.5 g/dL (ref 30.0–36.0)
MCV: 99.3 fl (ref 78.0–100.0)
MONO ABS: 0.5 10*3/uL (ref 0.1–1.0)
Monocytes Relative: 8.1 % (ref 3.0–12.0)
Neutro Abs: 4 10*3/uL (ref 1.4–7.7)
Neutrophils Relative %: 65.2 % (ref 43.0–77.0)
Platelets: 264 10*3/uL (ref 150.0–400.0)
RBC: 4.37 Mil/uL (ref 4.22–5.81)
RDW: 13.7 % (ref 11.5–15.5)
WBC: 6.1 10*3/uL (ref 4.0–10.5)

## 2017-10-08 LAB — HEPATIC FUNCTION PANEL
ALT: 20 U/L (ref 0–53)
AST: 19 U/L (ref 0–37)
Albumin: 4.4 g/dL (ref 3.5–5.2)
Alkaline Phosphatase: 89 U/L (ref 39–117)
BILIRUBIN DIRECT: 0.2 mg/dL (ref 0.0–0.3)
BILIRUBIN TOTAL: 0.9 mg/dL (ref 0.2–1.2)
Total Protein: 6.5 g/dL (ref 6.0–8.3)

## 2017-10-08 LAB — TSH: TSH: 1.02 u[IU]/mL (ref 0.35–4.50)

## 2017-10-08 LAB — POC URINALSYSI DIPSTICK (AUTOMATED)
Bilirubin, UA: NEGATIVE
Blood, UA: NEGATIVE
Glucose, UA: NEGATIVE
Ketones, UA: NEGATIVE
Leukocytes, UA: NEGATIVE
Nitrite, UA: NEGATIVE
Protein, UA: NEGATIVE
Spec Grav, UA: 1.01 (ref 1.010–1.025)
Urobilinogen, UA: 0.2 E.U./dL
pH, UA: 7 (ref 5.0–8.0)

## 2017-10-08 LAB — LIPID PANEL
Cholesterol: 153 mg/dL (ref 0–200)
HDL: 47.4 mg/dL (ref >39.00)
LDL Cholesterol: 97 mg/dL (ref 0–99)
NonHDL: 105.38
Total CHOL/HDL Ratio: 3
Triglycerides: 43 mg/dL (ref 0.0–149.0)
VLDL: 8.6 mg/dL (ref 0.0–40.0)

## 2017-10-08 LAB — BASIC METABOLIC PANEL
BUN: 12 mg/dL (ref 6–23)
CO2: 31 mEq/L (ref 19–32)
Calcium: 9.6 mg/dL (ref 8.4–10.5)
Chloride: 91 mEq/L — ABNORMAL LOW (ref 96–112)
Creatinine, Ser: 0.82 mg/dL (ref 0.40–1.50)
GFR: 101.79 mL/min (ref >60.00)
Glucose, Bld: 100 mg/dL — ABNORMAL HIGH (ref 70–99)
Potassium: 4.4 mEq/L (ref 3.5–5.1)
Sodium: 128 mEq/L — ABNORMAL LOW (ref 135–145)

## 2017-10-08 LAB — PSA: PSA: 0.49 ng/mL (ref 0.10–4.00)

## 2017-10-08 MED ORDER — CLONIDINE HCL 0.2 MG PO TABS: ORAL_TABLET | ORAL | 3 refills | Status: DC

## 2017-10-08 MED ORDER — AMLODIPINE BESYLATE 5 MG PO TABS: 5.0000 mg | ORAL_TABLET | Freq: Every day | ORAL | 3 refills | Status: DC

## 2017-10-08 MED ORDER — METOPROLOL SUCCINATE ER 200 MG PO TB24
200.0000 mg | ORAL_TABLET | Freq: Every day | ORAL | 3 refills | Status: DC
Start: 2017-10-08 — End: 2018-10-14

## 2017-10-08 MED ORDER — LOSARTAN POTASSIUM 100 MG PO TABS: ORAL_TABLET | ORAL | 3 refills | Status: DC

## 2017-10-08 NOTE — Progress Notes (Signed)
   Subjective:    Patient ID: [name], male    DOB: 1956-04-27, 61 y.o.   MRN: [id number]  HPI Here for a well exam. He feels well. 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    Review of Systems  Constitutional: Negative.   HENT: Negative.   Eyes: Negative.   Respiratory: Negative.   Cardiovascular: Negative.   Gastrointestinal: Negative.   Genitourinary: Negative.   Musculoskeletal: Negative.   Skin: Negative.   Neurological: Negative.   Psychiatric/Behavioral: Negative.        Objective:   Physical Exam  Constitutional: He is oriented to person, place, and time. He appears well-developed and well-nourished. No distress.  HENT:  Head: Normocephalic and atraumatic.  Right Ear: External ear normal.  Left Ear: External ear normal.  Nose: Nose normal.  Mouth/Throat: Oropharynx is clear and moist. No oropharyngeal exudate.  Eyes: Conjunctivae and EOM are normal. Pupils are equal, round, and reactive to light. Right eye exhibits no discharge. Left eye exhibits no discharge. No scleral icterus.  Neck: Neck supple. No JVD present. No tracheal deviation present. No thyromegaly present.  Cardiovascular: Normal rate, regular rhythm, normal heart sounds and intact distal pulses. Exam reveals no gallop and no friction rub.  No murmur heard. Pulmonary/Chest: Effort normal and breath sounds normal. No respiratory distress. He has no wheezes. He has no rales. He exhibits no tenderness.  Abdominal: Soft. Bowel sounds are normal. He exhibits no distension and no mass. There is no tenderness. There is no rebound and no guarding.  Genitourinary: Rectum normal, prostate normal and penis normal. Rectal exam shows guaiac negative stool. No penile tenderness.  Musculoskeletal: Normal range of motion. He exhibits no edema or tenderness.  Lymphadenopathy:    He has no cervical adenopathy.  Neurological: He is alert and oriented to person, place, and time. He has normal reflexes. No cranial nerve  deficit. He exhibits normal muscle tone. Coordination normal.  Skin: Skin is warm and dry. No rash noted. He is not diaphoretic. No erythema. No pallor.  Psychiatric: He has a normal mood and affect. His behavior is normal. Judgment and thought content normal.          Assessment & Plan:  Well exam. We discussed diet and exercise. Get fasting labs. Gershon CraneStephen Lunabelle Oatley, MD

## 2017-10-11 ENCOUNTER — Telehealth: Payer: Self-pay | Admitting: Family Medicine

## 2017-10-11 NOTE — Telephone Encounter (Signed)
Copied from CRM [id number]. Topic: Quick Communication - See Telephone Encounter >> Oct 11, 2017  8:41 AM Rudi CocoLathan, Stephane Junkins M, NT wrote: CRM for notification. See Telephone encounter for:   10/11/17. Pt. Has questions about his medications dosage    losartan 100mg  supposed to be 50mg  2x a day Amlodipine 5mg  supposed to be 10mg    [facility] [place], [place] - [address] [place] [id number] Phone: [phone] Fax: [phone] Not a 24 hour pharmacy; exact hours not known

## 2017-10-11 NOTE — Telephone Encounter (Signed)
Called pt and left a VM to call back to discuss concerns. CRM created.  

## 2017-10-12 NOTE — Telephone Encounter (Signed)
Pt running your call. 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  Cannot talk on phone while driving.  Will call back if he doesn't get your return call

## 2017-10-13 ENCOUNTER — Telehealth: Payer: Self-pay

## 2017-10-13 MED ORDER — AMLODIPINE BESYLATE 5 MG PO TABS: 5.0000 mg | ORAL_TABLET | Freq: Every day | ORAL | 0 refills | Status: DC

## 2017-10-13 NOTE — Telephone Encounter (Signed)
Pt stated that he has already picked up his Rx's for the amlodipine and the losartan. Pt stated that he takes the amlodipine 10 mg once daily and he takes losartan 100 mg half a tablet twice daily. However, at his last OV Dr. Clent RidgesFry stated that he was fine with sending in a new Rx for the 50 MG tablet 1 tablet twice daily. Pt stated that since he has already pick up these Rx's could he have doctor Clent RidgesFry just send in another Rx for the amlodipine this was sent in for the the 5 MG once daily which is not the dose he is on it should have been 10 MG once daily. Next Rx that is sent for the losartan can be refilled for the 50 MG twice daily rather than the 100 MG dose because pt stated that it is hard to cut the tablets in halfs. Sent to PCP

## 2017-10-13 NOTE — Telephone Encounter (Signed)
Rx was sent to [facility] for a 90 day supply with NO refills. Pt is aware of the situation. I advised pt to call back in 90 day 3 days in advance before his next Rx's are due for refills so we can fix these errors and refill his Rx's correctly.

## 2017-10-13 NOTE — Telephone Encounter (Signed)
Copied from CRM [id number]. Topic: Quick Communication - See Telephone Encounter >> Oct 11, 2017  8:41 AM Rudi CocoLathan, Latoya M, NT wrote: CRM for notification. See Telephone encounter for:   10/11/17. Pt. Has questions about his medications dosage    losartan 100mg  supposed to be 50mg  2x a day Amlodipine 5mg  supposed to be 10mg    [facility] - [place], [place] - [address] [place] [id number] Phone: [phone] Fax: [phone] Not a 24 hour pharmacy; exact hours not known   >> Oct 12, 2017  8:54 AM Crist InfanteHarrald, Kathy J wrote: Pt returning your call. Please call back. >> Oct 13, 2017 12:23 PM Leafy RoRobinson, Norma J wrote: Pt is requesting shelby to return his call concerning [facility]

## 2017-10-13 NOTE — Telephone Encounter (Signed)
Call in another #90 of Amlodipine 5 mg daily with no refills (I suggest he use a different pharmacy). That way he can take two tabs a day (for a total of 10 mg). After that 90 days we will send in a new rx for the 10 mg tabs to his regular pharmacy

## 2017-10-13 NOTE — Telephone Encounter (Signed)
This has been addressed already with the pt see past telephone encounter.

## 2017-10-17 ENCOUNTER — Other Ambulatory Visit: Payer: Self-pay | Admitting: Family Medicine

## 2017-10-18 ENCOUNTER — Telehealth: Payer: Self-pay | Admitting: Family Medicine

## 2017-10-18 NOTE — Telephone Encounter (Signed)
Looks like a dose change?

## 2017-10-18 NOTE — Telephone Encounter (Signed)
Please tell him that his lab results are normal. Continue with current diet

## 2017-10-18 NOTE — Telephone Encounter (Signed)
Dr. Fry patient. 

## 2017-10-18 NOTE — Telephone Encounter (Signed)
Copied from CRM [id number]. Topic: Quick Communication - Office Called Patient >> Oct 18, 2017  4:20 PM Stephannie LiSimmons, Eriberto Felch L, [place] wrote: Reason for WUJ:[id number]:Patient returning call from office ,no message left please call again before 5 pm  today if possible

## 2017-10-18 NOTE — Telephone Encounter (Signed)
Pt advised and voiced understanding. Lab placed to be mailed to pt.

## 2017-10-18 NOTE — Telephone Encounter (Signed)
Spoke to pt about the issues with his amlodipine Rx. Spoke to Dr. Clent RidgesFry and he suggested for the pt to take the 5 MG tablet twice daily call us a few days in advance and let us know that he needs the 10 MG dose of the amlodipine refilled. Pt stated that he has NOT heard back back his lab results. Sent to PCP for the chance to review lab results.

## 2017-10-20 ENCOUNTER — Telehealth: Payer: Self-pay

## 2017-10-20 MED ORDER — LOSARTAN POTASSIUM 50 MG PO TABS: 50.0000 mg | ORAL_TABLET | Freq: Every day | ORAL | 3 refills | Status: DC

## 2017-10-20 NOTE — Telephone Encounter (Signed)
Change Losartan to 50 mg to take daily, call in #90 with 3 rf

## 2017-10-20 NOTE — Telephone Encounter (Signed)
Rx sent to pt's pharmacy with the dose change for convince for the pt unable to cut the 100 MG in half.

## 2017-10-20 NOTE — Telephone Encounter (Signed)
Pt says these are hard to cut please send in new Rx for 50 mg for the losartan (cozaar). Sent to PCP for approval.

## 2018-01-14 ENCOUNTER — Telehealth: Payer: Self-pay | Admitting: Family Medicine

## 2018-01-14 NOTE — Telephone Encounter (Signed)
Copied from CRM [id number]. Topic: Quick Communication - See Telephone Encounter >> Jan 14, 2018  9:02 AM Oneal GroutSebastian, Jennifer S wrote: CRM for notification. See Telephone encounter for: Patient would like to speak with nurse regarding meds. Stating amLODipine (NORVASC) 5 MG tablet, should be 10mg  and  losartan (COZAAR) 100 MG tablet, should be 50mg  2x a day. Please advise  01/14/18.

## 2018-01-14 NOTE — Telephone Encounter (Signed)
Pt needs refills on Amlodipine and Losartan. Pt was told to contact the office when refills were due so that new prescriptions with correct dosages could be written per previous telephone encounters on 11/26 and 11/28. Pt will need a new prescription for Amlodipine 10 mg tablet to be taken once daily. Previous prescription was written for Amlodipine 5mg  and pt was told by Dr. Clent RidgesFry to finish current prescription that was filled and to take 5mg  twice a day. Pt also will need a new prescription for Losartan. Pt has been taking 50mg  twice a day. Pt previously requested to be supplied with 50mg  tablets instead of 100mg  tablets due to the pt having a hard time splitting the egg-shaped tablet. Prescription written on 10/20/17 for Losartan states to take 50mg  tablets daily. Pt needs clarity on if he be taking 50mg  a day or 100mg  a day.

## 2018-01-17 NOTE — Telephone Encounter (Signed)
Sent to PCP to advise   Ok to send in Amlodipine 10 MG once daily and losartan 50 mg take 1 tablet twice daily ? ( or should it just be 50 MG once daily)

## 2018-01-18 MED ORDER — AMLODIPINE BESYLATE 10 MG PO TABS: 10.0000 mg | ORAL_TABLET | Freq: Every day | ORAL | 3 refills | Status: DC

## 2018-01-18 MED ORDER — LOSARTAN POTASSIUM 50 MG PO TABS: 50.0000 mg | ORAL_TABLET | Freq: Two times a day (BID) | ORAL | 3 refills | Status: DC

## 2018-01-18 NOTE — Telephone Encounter (Signed)
Take Amlodipine 10 mg once daily (call in #90 with 3 rf) and take Losartan 50 mg bid  (call in #180 with 3 rf)

## 2018-01-18 NOTE — Telephone Encounter (Signed)
Rx's have been sent. Pt advised and voiced understanding.

## 2018-03-17 ENCOUNTER — Other Ambulatory Visit: Payer: Self-pay | Admitting: Family Medicine

## 2018-04-08 ENCOUNTER — Ambulatory Visit (INDEPENDENT_AMBULATORY_CARE_PROVIDER_SITE_OTHER)
Admission: RE | Admit: 2018-04-08 | Discharge: 2018-04-08 | Disposition: A | Discharge status: Home / Self Care | Payer: BLUE CROSS/BLUE SHIELD | Source: Ambulatory Visit | Attending: Family Medicine | Admitting: Family Medicine

## 2018-04-08 ENCOUNTER — Ambulatory Visit: Payer: BLUE CROSS/BLUE SHIELD | Admitting: Family Medicine

## 2018-04-08 ENCOUNTER — Encounter: Payer: Self-pay | Admitting: Family Medicine

## 2018-04-08 VITALS — BP 148/86 | HR 60 | Temp 97.5°F | Wt 155.0 lb

## 2018-04-08 DIAGNOSIS — R05 Cough: Secondary | ICD-10-CM

## 2018-04-08 DIAGNOSIS — B353 Tinea pedis: Secondary | ICD-10-CM | POA: Diagnosis not present

## 2018-04-08 DIAGNOSIS — I10 Essential (primary) hypertension: Secondary | ICD-10-CM | POA: Diagnosis not present

## 2018-04-08 DIAGNOSIS — R059 Cough, unspecified: Secondary | ICD-10-CM

## 2018-04-08 DIAGNOSIS — Z720 Tobacco use: Secondary | ICD-10-CM | POA: Diagnosis not present

## 2018-04-08 MED ORDER — KETOCONAZOLE 2 % EX CREA: 1.0000 "application " | TOPICAL_CREAM | Freq: Two times a day (BID) | CUTANEOUS | 2 refills | Status: AC | PRN

## 2018-04-08 NOTE — Progress Notes (Signed)
   Subjective:    Patient ID: [name], male    DOB: 12/10/1955, 62 y.o.   MRN: [id number]  HPI Here to follow up on HTN. He has been doing well. He asks about an itchy rash on the right foot that appeared a month ago. He still smokes between a pack and 1 and 1/2 packs a day.    Review of Systems  Constitutional: Negative.   Respiratory: Negative.   Cardiovascular: Negative.   Neurological: Negative.        Objective:   Physical Exam  Constitutional: He is oriented to person, place, and time. He appears well-developed and well-nourished.  Cardiovascular: Normal rate, regular rhythm, normal heart sounds and intact distal pulses.  Pulmonary/Chest: Effort normal. No stridor. No respiratory distress. He has no rales.  Soft scattered wheezes   Musculoskeletal: He exhibits no edema.  Neurological: He is alert and oriented to person, place, and time.          Assessment & Plan:  His HTN is stable. He has tinea pedis and we will treat this with Ketoconazole cream. He likely has the beginnings of COPD so we will send him for a CXR. I again urged him to quit smoking.  Gershon Crane, MD

## 2018-08-02 ENCOUNTER — Other Ambulatory Visit: Payer: Self-pay | Admitting: Family Medicine

## 2018-10-14 ENCOUNTER — Ambulatory Visit (INDEPENDENT_AMBULATORY_CARE_PROVIDER_SITE_OTHER): Payer: BLUE CROSS/BLUE SHIELD | Admitting: Family Medicine

## 2018-10-14 ENCOUNTER — Encounter: Payer: Self-pay | Admitting: Family Medicine

## 2018-10-14 VITALS — BP 138/84 | HR 53 | Temp 97.8°F | Ht 66.0 in | Wt 155.1 lb

## 2018-10-14 DIAGNOSIS — Z125 Encounter for screening for malignant neoplasm of prostate: Secondary | ICD-10-CM

## 2018-10-14 DIAGNOSIS — Z23 Encounter for immunization: Secondary | ICD-10-CM | POA: Diagnosis not present

## 2018-10-14 DIAGNOSIS — Z0000 Encounter for general adult medical examination without abnormal findings: Secondary | ICD-10-CM | POA: Diagnosis not present

## 2018-10-14 LAB — LIPID PANEL
CHOL/HDL RATIO: 3
Cholesterol: 147 mg/dL (ref 0–200)
HDL: 44.8 mg/dL (ref >39.00)
LDL CALC: 92 mg/dL (ref 0–99)
NonHDL: 101.95
TRIGLYCERIDES: 51 mg/dL (ref 0.0–149.0)
VLDL: 10.2 mg/dL (ref 0.0–40.0)

## 2018-10-14 LAB — BASIC METABOLIC PANEL
BUN: 13 mg/dL (ref 6–23)
CHLORIDE: 95 meq/L — AB (ref 96–112)
CO2: 30 mEq/L (ref 19–32)
Calcium: 9.4 mg/dL (ref 8.4–10.5)
Creatinine, Ser: 1.03 mg/dL (ref 0.40–1.50)
GFR: 77.97 mL/min (ref >60.00)
Glucose, Bld: 97 mg/dL (ref 70–99)
Potassium: 5 mEq/L (ref 3.5–5.1)
Sodium: 130 mEq/L — ABNORMAL LOW (ref 135–145)

## 2018-10-14 LAB — CBC WITH DIFFERENTIAL/PLATELET
Basophils Absolute: 0.1 10*3/uL (ref 0.0–0.1)
Basophils Relative: 1 % (ref 0.0–3.0)
EOS PCT: 3.4 % (ref 0.0–5.0)
Eosinophils Absolute: 0.2 10*3/uL (ref 0.0–0.7)
HCT: 40.8 % (ref 39.0–52.0)
Hemoglobin: 14.1 g/dL (ref 13.0–17.0)
Lymphocytes Relative: 21.7 % (ref 12.0–46.0)
Lymphs Abs: 1.2 10*3/uL (ref 0.7–4.0)
MCHC: 34.6 g/dL (ref 30.0–36.0)
MCV: 97.3 fl (ref 78.0–100.0)
MONOS PCT: 8.8 % (ref 3.0–12.0)
Monocytes Absolute: 0.5 10*3/uL (ref 0.1–1.0)
NEUTROS ABS: 3.6 10*3/uL (ref 1.4–7.7)
NEUTROS PCT: 65.1 % (ref 43.0–77.0)
PLATELETS: 273 10*3/uL (ref 150.0–400.0)
RBC: 4.19 Mil/uL — ABNORMAL LOW (ref 4.22–5.81)
RDW: 13.1 % (ref 11.5–15.5)
WBC: 5.5 10*3/uL (ref 4.0–10.5)

## 2018-10-14 LAB — POC URINALSYSI DIPSTICK (AUTOMATED)
BILIRUBIN UA: NEGATIVE
Blood, UA: NEGATIVE
Glucose, UA: NEGATIVE
KETONES UA: NEGATIVE
LEUKOCYTES UA: NEGATIVE
Nitrite, UA: NEGATIVE
Protein, UA: NEGATIVE
Spec Grav, UA: 1.01 (ref 1.010–1.025)
Urobilinogen, UA: 0.2 E.U./dL
pH, UA: 7.5 (ref 5.0–8.0)

## 2018-10-14 LAB — HEPATIC FUNCTION PANEL
ALK PHOS: 77 U/L (ref 39–117)
ALT: 16 U/L (ref 0–53)
AST: 15 U/L (ref 0–37)
Albumin: 4.4 g/dL (ref 3.5–5.2)
Bilirubin, Direct: 0.2 mg/dL (ref 0.0–0.3)
TOTAL PROTEIN: 6.3 g/dL (ref 6.0–8.3)
Total Bilirubin: 1 mg/dL (ref 0.2–1.2)

## 2018-10-14 LAB — PSA: PSA: 0.53 ng/mL (ref 0.10–4.00)

## 2018-10-14 LAB — TSH: TSH: 1.22 u[IU]/mL (ref 0.35–4.50)

## 2018-10-14 MED ORDER — AMLODIPINE BESYLATE 10 MG PO TABS: 10.0000 mg | ORAL_TABLET | Freq: Every day | ORAL | 3 refills | Status: DC

## 2018-10-14 MED ORDER — METOPROLOL SUCCINATE ER 200 MG PO TB24: 200.0000 mg | ORAL_TABLET | Freq: Every day | ORAL | 3 refills | Status: DC

## 2018-10-14 MED ORDER — LOSARTAN POTASSIUM 50 MG PO TABS: 50.0000 mg | ORAL_TABLET | Freq: Two times a day (BID) | ORAL | 3 refills | Status: DC

## 2018-10-14 MED ORDER — CLONIDINE HCL 0.2 MG PO TABS: ORAL_TABLET | ORAL | 3 refills | Status: DC

## 2018-10-14 NOTE — Progress Notes (Signed)
   Subjective:    Patient ID: [name], male    DOB: 05-Jun-1956, 62 y.o.   MRN: [id number]  HPI Here for a well exam. 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    Review of Systems  Constitutional: Negative.   HENT: Negative.   Eyes: Negative.   Respiratory: Negative.   Cardiovascular: Negative.   Gastrointestinal: Negative.   Genitourinary: Negative.   Musculoskeletal: Negative.   Skin: Negative.   Neurological: Negative.   Psychiatric/Behavioral: Negative.        Objective:   Physical Exam  Constitutional: He is oriented to person, place, and time. He appears well-developed and well-nourished. No distress.  HENT:  Head: Normocephalic and atraumatic.  Right Ear: External ear normal.  Left Ear: External ear normal.  Nose: Nose normal.  Mouth/Throat: Oropharynx is clear and moist. No oropharyngeal exudate.  Eyes: Pupils are equal, round, and reactive to light. Conjunctivae and EOM are normal. Right eye exhibits no discharge. Left eye exhibits no discharge. No scleral icterus.  Neck: Neck supple. No JVD present. No tracheal deviation present. No thyromegaly present.  Cardiovascular: Normal rate, regular rhythm, normal heart sounds and intact distal pulses. Exam reveals no gallop and no friction rub.  No murmur heard. Pulmonary/Chest: Effort normal and breath sounds normal. No respiratory distress. He has no wheezes. He has no rales. He exhibits no tenderness.  Abdominal: Soft. Bowel sounds are normal. He exhibits no distension and no mass. There is no tenderness. There is no rebound and no guarding.  Genitourinary: Rectum normal, prostate normal and penis normal. Rectal exam shows guaiac negative stool. No penile tenderness.  Musculoskeletal: Normal range of motion. He exhibits no edema or tenderness.  Lymphadenopathy:    He has no cervical adenopathy.  Neurological: He is alert and oriented to person, place, and time. He has normal reflexes. He  displays normal reflexes. No cranial nerve deficit. He exhibits normal muscle tone. Coordination normal.  Skin: Skin is warm and dry. No rash noted. He is not diaphoretic. No erythema. No pallor.  Psychiatric: He has a normal mood and affect. His behavior is normal. Judgment and thought content normal.          Assessment & Plan:  Well exam. We discussed diet and exercise. Get fasting labs.  Gershon CraneStephen Daruis Swaim, MD

## 2018-12-28 ENCOUNTER — Ambulatory Visit: Payer: BLUE CROSS/BLUE SHIELD | Admitting: Family Medicine

## 2018-12-28 ENCOUNTER — Encounter: Payer: Self-pay | Admitting: Family Medicine

## 2018-12-28 VITALS — BP 142/88 | HR 72 | Temp 98.0°F | Wt 154.4 lb

## 2018-12-28 DIAGNOSIS — N492 Inflammatory disorders of scrotum: Secondary | ICD-10-CM

## 2018-12-28 MED ORDER — DOXYCYCLINE HYCLATE 100 MG PO TABS: 100.0000 mg | ORAL_TABLET | Freq: Two times a day (BID) | ORAL | 0 refills | Status: DC

## 2018-12-28 NOTE — Progress Notes (Signed)
   Subjective:    Patient ID: [name], male    DOB: 1956/10/13, 63 y.o.   MRN: [id number]  HPI Here for 3 weeks of a painful boil on the scrotum. It drains some fluid at times. No fever.    Review of Systems  Constitutional: Negative.   Respiratory: Negative.   Cardiovascular: Negative.   Skin: Positive for wound.       Objective:   Physical Exam Constitutional:      Appearance: Normal appearance.  Cardiovascular:     Rate and Rhythm: Normal rate and regular rhythm.     Pulses: Normal pulses.     Heart sounds: Normal heart sounds.  Pulmonary:     Effort: Pulmonary effort is normal.     Breath sounds: Normal breath sounds.  Genitourinary:    Comments: The left scrotum has a tender boil with 2 separate heads. I am able to express purulent material from these. No regional adenopathy  Neurological:     Mental Status: He is alert.           Assessment & Plan:  Boil, treat with warm compresses and Doxycycline for 10 days. Culture the expressed fluid.  Gershon Crane, MD

## 2018-12-31 LAB — WOUND CULTURE
MICRO NUMBER:: 154754
SPECIMEN QUALITY:: ADEQUATE

## 2019-01-18 ENCOUNTER — Ambulatory Visit: Payer: BLUE CROSS/BLUE SHIELD | Admitting: Family Medicine

## 2019-01-18 ENCOUNTER — Encounter: Payer: Self-pay | Admitting: Family Medicine

## 2019-01-18 VITALS — BP 120/80 | HR 66 | Temp 98.1°F | Ht 66.0 in | Wt 157.0 lb

## 2019-01-18 DIAGNOSIS — M25511 Pain in right shoulder: Secondary | ICD-10-CM

## 2019-01-18 MED ORDER — METHYLPREDNISOLONE 4 MG PO TBPK: ORAL_TABLET | ORAL | 0 refills | Status: DC

## 2019-01-18 MED ORDER — TRAMADOL HCL 50 MG PO TABS: 100.0000 mg | ORAL_TABLET | Freq: Every evening | ORAL | 0 refills | Status: DC | PRN

## 2019-01-18 NOTE — Progress Notes (Signed)
   Subjective:    Patient ID: [name], male    DOB: [birth year], 63 y.o.   MRN: [id number]  HPI Here for 4 days of an aching pain in the right shoulder. He woke up with this a few days ago. No recent trauma. Sometimes he gets slight numbness or tingling in the right arm. He gets relief by holding his arm up and behind his head. Ibuprofen does not help.    Review of Systems  Constitutional: Negative.   Respiratory: Negative.   Cardiovascular: Negative.   Musculoskeletal: Positive for arthralgias.  Neurological: Positive for numbness. Negative for weakness.       Objective:   Physical Exam Constitutional:      Appearance: Normal appearance.  Cardiovascular:     Rate and Rhythm: Normal rate and regular rhythm.     Pulses: Normal pulses.     Heart sounds: Normal heart sounds.  Pulmonary:     Effort: Pulmonary effort is normal.     Breath sounds: Normal breath sounds.  Musculoskeletal:     Comments: The right shoulder has full ROM. No tenderness at all. Slight crepitus is present   Neurological:     General: No focal deficit present.     Mental Status: He is alert.     Sensory: No sensory deficit.     Motor: No weakness.           Assessment & Plan:  Right shoulder pain, possible impingement. He will try a Medrol dose pack. Use Tramadol at bedtime for sleep. He will recheck if not better by next week.  Gershon Crane, MD

## 2019-01-23 ENCOUNTER — Telehealth: Payer: Self-pay

## 2019-01-23 DIAGNOSIS — M25511 Pain in right shoulder: Principal | ICD-10-CM

## 2019-01-23 DIAGNOSIS — G8929 Other chronic pain: Secondary | ICD-10-CM

## 2019-01-23 NOTE — Telephone Encounter (Signed)
Patient is calling back to check on the status of the referral Please advise. Thank you.

## 2019-01-23 NOTE — Telephone Encounter (Signed)
The referral was done  

## 2019-01-23 NOTE — Telephone Encounter (Signed)
Dr. Clent Ridges please advise on referral for shoulder pain.  Thanks

## 2019-01-23 NOTE — Telephone Encounter (Signed)
Copied from CRM [id number]. Topic: General - Other >> Jan 23, 2019  8:10 AM Elliot Gault wrote: Patient was last seen 01/18/2019 for right shoulder pain and was advised to follow up if symptoms didn't improve, patient requesting referral to orthopedic

## 2019-01-24 ENCOUNTER — Ambulatory Visit (INDEPENDENT_AMBULATORY_CARE_PROVIDER_SITE_OTHER): Payer: BLUE CROSS/BLUE SHIELD

## 2019-01-24 ENCOUNTER — Ambulatory Visit (INDEPENDENT_AMBULATORY_CARE_PROVIDER_SITE_OTHER): Payer: BLUE CROSS/BLUE SHIELD | Admitting: Family Medicine

## 2019-01-24 ENCOUNTER — Encounter (INDEPENDENT_AMBULATORY_CARE_PROVIDER_SITE_OTHER): Payer: Self-pay | Admitting: Orthopedic Surgery

## 2019-01-24 DIAGNOSIS — M5410 Radiculopathy, site unspecified: Secondary | ICD-10-CM

## 2019-01-24 MED ORDER — GABAPENTIN 300 MG PO CAPS: 300.0000 mg | ORAL_CAPSULE | Freq: Every day | ORAL | 3 refills | Status: DC

## 2019-01-24 MED ORDER — TIZANIDINE HCL 2 MG PO TABS: 2.0000 mg | ORAL_TABLET | Freq: Four times a day (QID) | ORAL | 1 refills | Status: DC | PRN

## 2019-01-24 MED ORDER — PREDNISONE 10 MG PO TABS: ORAL_TABLET | ORAL | 0 refills | Status: DC

## 2019-01-24 NOTE — Progress Notes (Signed)
  [name] - 63 y.o. male MRN [id number]  Date of birth: 11-16-1956    SUBJECTIVE:      Chief Complaint: Right shoulder pain  HPI:  62 year old male with pain in the right shoulder for 1 week.  Patient states that he was woke up from sleep by pain.  He has had a constant throbbing pain in the lateral right shoulder since that time.  No history of previous shoulder pain or injury.  He is right-hand dominant.  His pain is made worse with allowing it to rest at his side.  Is improved with holding his arm over his head.  He has no increased pain with range of motion, in fact this makes it feel better.  He occasionally feels numbness and tingling distally in the arm if it hangs in his side.  Previously he was prescribed prednisone by his PCP which provided no relief.  He denies feeling of any weakness.  No skin changes.   ROS:     See HPI  PERTINENT  PMH / PSH FH / / SH:  Past Medical, Surgical, Social, and Family History Reviewed & Updated in the EMR.   OBJECTIVE: There were no vitals taken for this visit.  Physical Exam:  Vital signs are reviewed.  GEN: Alert and oriented, NAD Pulm: Breathing unlabored PSY: normal mood, congruent affect  MSK: C-spine: No obvious deformity No spinous process tenderness.  No tenderness over the paraspinal muscles or trapezius bilateral Full range of motion without pain 4/5 strength with biceps flexion on right, 5/5 biceps flexion on left N/V intact distally 1+ biceps DTR on the right, 2+ biceps DTR on the left. Otherwise 2+ DTRs in UE  Right shoulder: No obvious deformity or asymmetry. No bruising. No swelling No TTP Full ROM in flexion, abduction, internal/external rotation without pain NV intact distally Special Tests:  - Impingement: Neg Hawkins and Neers.  - Supraspinatus: Negative empty can.  4+/5 strength - Infraspinatus/Teres: 4+/5 strength with ER - Subscapularis:  . 5/5 strength with IR - Biceps tendon: Negative Speeds.  -  Labrum: Negative Obriens.   Left shoulder: No obvious deformity No tenderness Full range of motion with 5/5 strength in RTC testing   ASSESSMENT & PLAN:  1.  Right shoulder pain and weakness- secondary to C5 radiculopathy.  Cervical x-rays obtained and independently reviewed today which show significant degenerative changes in the cervical spine.  -MRI -Physical therapy - prednisone 20mg  daily x 14 days - gabapentin 300mg  qhs - tizanidine - Follow-up after physical therapy

## 2019-01-24 NOTE — Progress Notes (Signed)
I saw and examined the patient with Dr. Jamse Mead and agree with assessment and plan as outlined.  History and exam suggest cervical radiculopathy.  Will try meds, PT.  MRI if fails to improve.

## 2019-01-25 ENCOUNTER — Telehealth (INDEPENDENT_AMBULATORY_CARE_PROVIDER_SITE_OTHER): Payer: Self-pay | Admitting: Family Medicine

## 2019-01-25 NOTE — Telephone Encounter (Signed)
I called and spoke with the patient. He has already called [facility] and left 2 messages for them to call him back to schedule the PT. I verified the address and phone number to the PT facility for him.

## 2019-01-25 NOTE — Telephone Encounter (Signed)
Patient called stating that Dr. Prince Rome was referring him to a PT facility in [place], but he has not heard from them, he would like to get a number for them so that he can call them himself and make the appointment.  CB#[phone].  Thank you.

## 2019-01-26 ENCOUNTER — Encounter (HOSPITAL_COMMUNITY): Payer: Self-pay

## 2019-01-26 ENCOUNTER — Other Ambulatory Visit: Payer: Self-pay

## 2019-01-26 ENCOUNTER — Ambulatory Visit (HOSPITAL_COMMUNITY): Payer: BLUE CROSS/BLUE SHIELD | Attending: Family Medicine

## 2019-01-26 DIAGNOSIS — R29818 Other symptoms and signs involving the nervous system: Secondary | ICD-10-CM | POA: Insufficient documentation

## 2019-01-26 DIAGNOSIS — M5412 Radiculopathy, cervical region: Secondary | ICD-10-CM | POA: Insufficient documentation

## 2019-01-26 DIAGNOSIS — M6281 Muscle weakness (generalized): Secondary | ICD-10-CM | POA: Insufficient documentation

## 2019-01-26 NOTE — Therapy (Signed)
[facility] [address] [place], [place], [address] Phone: [phone]   Fax:  [phone]  Physical Therapy Evaluation  Patient Details  Name: [name] MRN: [id number] Date of Birth: 1956/01/07 Referring Provider (PT): Lavada Mesi, MD   Encounter Date: 01/26/2019  PT End of Session - 01/26/19 1751    Visit Number  1    Number of Visits  12    Date for PT Re-Evaluation  02/23/19    Authorization Type  [employer] (no auth required, no visit limit)    Authorization Time Period  01/26/2019 - 02/23/2019    PT Start Time  1650    PT Stop Time  1738    PT Time Calculation (min)  48 min    Activity Tolerance  Patient tolerated treatment well    Behavior During Therapy  [name]       Past Medical History:  Diagnosis Date  . Concussion 08-2007  . Erectile dysfunction   . Fracture of left hand   . Hypertension     Past Surgical History:  Procedure Laterality Date  . COLONOSCOPY  12-03-14   per Dr. Marina Goodell, diverticulosis only, repeat in 10 yrs  . LEFT HEART CATHETERIZATION WITH CORONARY ANGIOGRAM N/A 10/22/2014   Procedure: LEFT HEART CATHETERIZATION WITH CORONARY ANGIOGRAM;  Surgeon: Runell Gess, MD;  Location: [facility] CATH LAB;  Service: Cardiovascular;  Laterality: N/A;    There were no vitals filed for this visit.   Subjective Assessment - 01/26/19 1652    Subjective  [name] reports on 01/16/19 he woke up with his Rt shoulder throbbing. He initially thought he had slept wrong and went into work but his shoulder continued to hurt and it did not improve over the next couple days. He states he saw his PCP Dr. Clent Ridges on Wednesday 01/18/19 and was given prednisone to see if this would help. It did not and he called Dr. Clent Ridges on Monday 01/23/19 for a referral to an orthopedist. He was seen on Tuesday 01/24/19 by Dr. Prince Rome and he took x-rays of patient's neck. [name] understood Dr. Prince Rome evaluation to be  that he has some aging of his lower cervical joints and some narrowing of the right side. He understood that his shoulder pain is related to the narrowing in his cervical spine. Dr. Prince Rome prescribed him a higher dose of prednisone, muscle relaxer, and medication to help him sleep as well as referred him to physical therapy.     Limitations  Other (comment)   working/driving   How long can you sit comfortably?  not limited    How long can you stand comfortably?  not limited    How long can you walk comfortably?  not limited    Diagnostic tests  x-rays indicating some degenration and narrowing C5-7    Patient Stated Goals  to get back to work without shoulder pain    Currently in Pain?  Yes    Pain Score  3     Pain Location  Shoulder    Pain Orientation  Right    Pain Descriptors / Indicators  Tingling;Aching    Pain Type  Acute pain    Pain Radiating Towards  radiates into forearm but does not go into hand    Pain Onset  1 to 4 weeks ago    Pain Frequency  Intermittent    Aggravating Factors   having his arm hang down at side  Pain Relieving Factors  raising his arm overhead         [facility] PT Assessment - 01/26/19 0001      Assessment   Medical Diagnosis  Cervical Radiculopathy    Referring Provider (PT)  Hilts, Michael, MD    Onset Date/Surgical Date  01/16/19    Hand Dominance  Right    Next MD Visit  Apr 14, 2019 with Dr. Clent RidgesFry    Prior Therapy  for hand years ago      Precautions   Precautions  None      Restrictions   Weight Bearing Restrictions  No      Balance Screen   Has the patient fallen in the past 6 months  No    Has the patient had a decrease in activity level because of a fear of falling?   No    Is the patient reluctant to leave their home because of a fear of falling?   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 His loads can be up to 2600lbs. He has been working for [employer] for 22 years.    Leisure  Patient enjoys hunting, and does normal housework. He needs to be able to chop wood for his wood stove.      Cognition   Overall Cognitive Status  Within Functional Limits for tasks assessed      Observation/Other Assessments   Focus on Therapeutic Outcomes (FOTO)   --   perform next session     ROM / Strength   AROM / PROM / Strength  AROM;Strength      AROM   AROM Assessment Site  Cervical    Cervical Flexion  58    Cervical Extension  40    Cervical - Right Side Bend  32    Cervical - Left Side Bend  30    Cervical - Right Rotation  68    Cervical - Left Rotation  85      Strength   Strength Assessment Site  Elbow;Shoulder;Forearm;Wrist;Hand    Right Shoulder Flexion  5/5    Right Shoulder Extension  5/5    Right Shoulder ABduction  4+/5    Right Shoulder Internal Rotation  5/5    Right Shoulder External Rotation  4+/5    Left Shoulder Flexion  5/5    Left Shoulder Extension  5/5    Left Shoulder ABduction  5/5    Left Shoulder Internal Rotation  5/5    Left Shoulder External Rotation  5/5    Right/Left Elbow  Right;Left    Right Elbow Flexion  4/5    Right Elbow Extension  5/5    Left Elbow Flexion  5/5    Left Elbow Extension  5/5    Right/Left Wrist  Right;Left    Right Wrist Flexion  5/5    Right Wrist Extension  4+/5    Right Wrist Radial Deviation  5/5    Right Wrist Ulnar Deviation  5/5  Left Wrist Flexion  5/5    Left Wrist Extension  5/5    Left Wrist Radial Deviation  5/5    Left Wrist Ulnar Deviation  5/5    Right Hand Grip (lbs)  62    Right Hand 3 Point Pinch  16 lbs    Left Hand Grip (lbs)   64    Left Hand 3 Point Pinch  16 lbs      Special Tests    Special Tests  Cervical    Cervical Tests  Spurling's;Dictraction;other      Spurling's   Findings  Negative    Side  Right      Distraction Test   Findngs  Negative    side  Right      other    Findings  Positive    Side  Right    Comment  ULTT1        Objective measurements completed on examination: See above findings.     PT Education - 01/26/19 1804    Education Details  Educated on exam findings and appropriate POC. Discussed pathology of cervical radiculopathy.    Person(s) Educated  Patient    Methods  Explanation    Comprehension  Verbalized understanding       PT Short Term Goals - 01/26/19 1754      PT SHORT TERM GOAL #1   Title  Patient will be independent with HEP, updated PRN, to improve mobility in cervical spine and functional strength.    Time  2    Period  Weeks    Status  New    Target Date  02/09/19        PT Long Term Goals - 01/26/19 1754      PT LONG TERM GOAL #1   Title  Patient will improve limited muscle groups on Rt UE by 1/2 grade to indicate improvement in funcitonal strength.     Time  4    Period  Weeks    Status  New    Target Date  02/23/19      PT LONG TERM GOAL #2   Title  Patient will improve Rt cervical rotation by 8 degrees or more to indicate significant improvement in joint mobility.    Time  4    Period  Weeks    Status  New      PT LONG TERM GOAL #3   Title  Patient will no longer experience radicular symptoms below his elbow to indicate reduction in radicular pain.     Time  4    Period  Weeks    Status  New      PT LONG TERM GOAL #4   Title  Patient will be able to tolerate 5 full days of work with no increase in shoulder pain to indicate improved tolerance to activities and reduced limitation.     Time  4    Period  Weeks    Status  New        Plan - 01/26/19 1753    Clinical Impression Statement  Mr. Rochford presents to physical  therapy for evaluation of Rt shoulder pain related to cervical radiculopathy. He presents with full ROM for bil shoulders and 5/5 strength throughout Lt UE. He has weakness for Rt elbow flexion and wrist extension indicating potential C5 nerve root involvement. He was negative for Spurling's and distraction testing but has limited Rt cervical rotation compared to Lt and is positive for ULTT1  on Rt UE. Patient has relief from symptoms with raising his Rt UE overhead. Objective findings suggest his symptoms are related to Rt C5 Radiculopathy and he will benefit from skilled PT interventions to address impairments.     Examination-Participation Restrictions  Driving    Stability/Clinical Decision Making  Evolving/Moderate complexity    Clinical Decision Making  Low    Rehab Potential  Good    PT Frequency  3x / week    PT Duration  4 weeks    PT Treatment/Interventions  ADLs/Self Care Home Management;Cryotherapy;Electrical Stimulation;Iontophoresis 4mg /ml Dexamethasone;Moist Heat;Traction;Therapeutic exercise;Neuromuscular re-education;Patient/family education;Manual techniques;Passive range of motion;Dry needling;Taping;Spinal Manipulations;Joint Manipulations    PT Next Visit Plan  Review goals. Initiate manual interventions for Rt C-spine PA's and lateral glide/opening on Rt c-spine. Initated median nerve gliding on Rt UE and functional strengthening and cervical spine retraction. Initiate HEP next visit with AROM and strengthening. (instruct on SNAG for cervical rotation and add to HEP if form is good)    Consulted and Agree with Plan of Care  Patient       Patient will benefit from skilled therapeutic intervention in order to improve the following deficits and impairments:  Decreased activity tolerance, Decreased range of motion, Decreased strength, Impaired sensation, Pain  Visit Diagnosis: Radiculopathy, cervical region  Muscle weakness (generalized)  Other symptoms and signs involving the  nervous system     Problem List Patient Active Problem List   Diagnosis Date Noted  . PAD (peripheral artery disease) (HCC) 11/06/2014  . Chest pain 10/22/2014  . Essential hypertension 10/22/2014  . Nicotine abuse 10/22/2014  . GERD (gastroesophageal reflux disease) 10/22/2014  . Hyponatremia 10/22/2014  . CONTUSION OF HAND 06/30/2010  . BENIGN PROSTATIC HYPERTROPHY, WITH OBSTRUCTION 01/03/2010  . CONTUSION, HEAD 09/12/2007  . LUMBAR STRAIN, ACUTE 08/02/2007  . WARTS, OTHER SPECIFIED VIRAL 07/01/2007  . CHICKENPOX, HX OF 07/01/2007    Valentino Saxon, PT, DPT, [facility] Physical Therapist with [facility]  01/26/2019 6:06 PM    [facility] [address] [place], [place], [address] Phone: [phone]   Fax:  [phone]  Name: [name] MRN: [id number] Date of Birth: [birth year]

## 2019-01-27 ENCOUNTER — Ambulatory Visit (HOSPITAL_COMMUNITY): Payer: BLUE CROSS/BLUE SHIELD

## 2019-01-27 ENCOUNTER — Encounter (HOSPITAL_COMMUNITY): Payer: Self-pay

## 2019-01-27 DIAGNOSIS — M6281 Muscle weakness (generalized): Secondary | ICD-10-CM | POA: Diagnosis not present

## 2019-01-27 DIAGNOSIS — M5412 Radiculopathy, cervical region: Secondary | ICD-10-CM | POA: Diagnosis not present

## 2019-01-27 DIAGNOSIS — R29818 Other symptoms and signs involving the nervous system: Secondary | ICD-10-CM | POA: Diagnosis not present

## 2019-01-27 NOTE — Therapy (Signed)
[facility] [address] [place], [place], [address] Phone: [phone]   Fax:  [phone]  Physical Therapy Treatment  Patient Details  Name: Adam FurlongJeffrey B Crickenberger MRN: [id number] Date of Birth: Apr 13, 1956 Referring Provider (PT): Lavada MesiHilts, Michael, MD   Encounter Date: 01/27/2019  PT End of Session - 01/27/19 0900    Visit Number  2    Number of Visits  12    Date for PT Re-Evaluation  02/23/19    Authorization Type  [employer] OTher (no auth required, no visit limit)    Authorization Time Period  01/26/2019 - 02/23/2019    PT Start Time  0900    PT Stop Time  0944    PT Time Calculation (min)  44 min    Activity Tolerance  Patient tolerated treatment well    Behavior During Therapy  [facility] for tasks assessed/performed       Past Medical History:  Diagnosis Date  . Concussion 08-2007  . Erectile dysfunction   . Fracture of left hand   . Hypertension     Past Surgical History:  Procedure Laterality Date  . COLONOSCOPY  12-03-14   per Dr. Marina GoodellPerry, diverticulosis only, repeat in 10 yrs  . LEFT HEART CATHETERIZATION WITH CORONARY ANGIOGRAM N/A 10/22/2014   Procedure: LEFT HEART CATHETERIZATION WITH CORONARY ANGIOGRAM;  Surgeon: Runell GessJonathan J Berry, MD;  [facility];  Service: Cardiovascular;  Laterality: N/A;    There were no vitals filed for this visit.  Subjective Assessment - 01/27/19 0900    Subjective  Pt states that he is sore in his shoulder from his eval yesterday. His pain feels "dead, deep in the joint."    Limitations  Other (comment)   working/driving   How long can you sit comfortably?  not limited    How long can you stand comfortably?  not limited    How long can you walk comfortably?  not limited    Diagnostic tests  x-rays indicating some degenration and narrowing C5-7    Patient Stated Goals  to get back to work without shoulder pain    Currently in Pain?  Yes    Pain Score  5     Pain Location  Shoulder    Pain  Orientation  Right    Pain Descriptors / Indicators  Aching    Pain Type  Acute pain    Pain Onset  1 to 4 weeks ago    Pain Frequency  Intermittent    Aggravating Factors   having his arm hang down at side    Pain Relieving Factors  raising his arm OH    Effect of Pain on Daily Activities  increases         [facility] PT Assessment - 01/27/19 0001      Observation/Other Assessments   Focus on Therapeutic Outcomes (FOTO)   4% limited          [facility] Adult PT Treatment/Exercise - 01/27/19 0001      Exercises   Exercises  Neck      Neck Exercises: Standing   Other Standing Exercises  repeated L lateral flexoin x20-30 reps (no change/did not increase pain); R lateral flexoin x3 reps increased R shoulder pain      Neck Exercises: Seated   Neck Retraction  15 reps    Neck Retraction Limitations  min cues for form    Other Seated Exercise  scap retraction 15x3" holds    Other Seated  Exercise  cervical SNAG + R rotation 15reps x3" holds      Manual Therapy   Manual Therapy  Joint mobilization;Neural Stretch    Manual therapy comments  seaparate rest of treatment    Joint Mobilization  Grade II-III CPAs to C1-C7, wih emphasis on lower cervical spine; increased R shoulder pain    Neural Stretch  R Median nerve flossing x25 reps (no change in pain)             PT Education - 01/27/19 0900    Education Details  reviewed goals, exercise technique, established HEP    Person(s) Educated  Patient    Methods  Explanation;Demonstration    Comprehension  Verbalized understanding;Returned demonstration       PT Short Term Goals - 01/26/19 1754      PT SHORT TERM GOAL #1   Title  Patient will be independent with HEP, updated PRN, to improve mobility in cervical spine and functional strength.    Time  2    Period  Weeks    Status  New    Target Date  02/09/19        PT Long Term Goals - 01/26/19 1754      PT LONG TERM GOAL #1   Title  Patient will improve limited muscle  groups on Rt UE by 1/2 grade to indicate improvement in funcitonal strength.     Time  4    Period  Weeks    Status  New    Target Date  02/23/19      PT LONG TERM GOAL #2   Title  Patient will improve Rt cervical rotation by 8 degrees or more to indicate significant improvement in joint mobility.    Time  4    Period  Weeks    Status  New      PT LONG TERM GOAL #3   Title  Patient will no longer experience radicular symptoms below his elbow to indicate reduction in radicular pain.     Time  4    Period  Weeks    Status  New      PT LONG TERM GOAL #4   Title  Patient will be able to tolerate 5 full days of work with no increase in shoulder pain to indicate improved tolerance to activities and reduced limitation.     Time  4    Period  Weeks    Status  New            Plan - 01/27/19 1002    Clinical Impression Statement  Began session by reviewing goals and administering FOTO; no f/u questions afterwards. Initiated POC with cervical and postural training. Min cues for form with cervical retraction, but pt able to demo understanding. Also began SNAG with rotation for improved cervical R rotation; added SNAG, cervical retraction, scap retraction, and repeated L cervical lateral flexion to HEP. Began median nerve flossing with no changes in shoulder pain. Also began cervical joint mobs this date and pt most tender at C6/7 and C7/T1 and reported increased deep R shoulder pain during. Ended with repeated movements for cervical lateral flexion and he had increased pain to the R and no change/no increase with L lateral flexion so educated pt to perform L lateral flexion repeatedly every hour to reduce his shoulder pain. Assessed soft tissue and pt with multiple taut bands/trigger points throughout R upper trap and posterior RTC mm, all of which were painful to palpation,, but  did not recreate same pain.     Examination-Participation Restrictions  Driving    Stability/Clinical Decision  Making  Evolving/Moderate complexity    Rehab Potential  Good    PT Frequency  3x / week    PT Duration  4 weeks    PT Treatment/Interventions  ADLs/Self Care Home Management;Cryotherapy;Electrical Stimulation;Iontophoresis 4mg /ml Dexamethasone;Moist Heat;Traction;Therapeutic exercise;Neuromuscular re-education;Patient/family education;Manual techniques;Passive range of motion;Dry needling;Taping;Spinal Manipulations;Joint Manipulations    PT Next Visit Plan  continue to assess manual interventions for Rt C-spine PA's, begin lateral glide/opening on Rt c-spine. continue median nerve gliding on Rt UE and functional strengthening and cervical spine retraction.    PT Home Exercise Plan  3/6: cervical retraction, scap retraction, SNAG +cervical R rotation, repeated L cervical lateral flexion    Consulted and Agree with Plan of Care  Patient       Patient will benefit from skilled therapeutic intervention in order to improve the following deficits and impairments:  Decreased activity tolerance, Decreased range of motion, Decreased strength, Impaired sensation, Pain, Postural dysfunction, Impaired UE functional use  Visit Diagnosis: Radiculopathy, cervical region  Muscle weakness (generalized)  Other symptoms and signs involving the nervous system     Problem List Patient Active Problem List   Diagnosis Date Noted  . PAD (peripheral artery disease) (HCC) 11/06/2014  . Chest pain 10/22/2014  . Essential hypertension 10/22/2014  . Nicotine abuse 10/22/2014  . GERD (gastroesophageal reflux disease) 10/22/2014  . Hyponatremia 10/22/2014  . CONTUSION OF HAND 06/30/2010  . BENIGN PROSTATIC HYPERTROPHY, WITH OBSTRUCTION 01/03/2010  . CONTUSION, HEAD 09/12/2007  . LUMBAR STRAIN, ACUTE 08/02/2007  . WARTS, OTHER SPECIFIED VIRAL 07/01/2007  . CHICKENPOX, HX OF 07/01/2007        Jac Canavan PT, DPT   [facility] [address]  [place], [place], [address] Phone: [phone]   Fax:  [phone]  Name: TYDUS OPDYKE MRN: [id number] Date of Birth: 24-Mar-1956

## 2019-01-30 ENCOUNTER — Telehealth: Payer: Self-pay | Admitting: Family Medicine

## 2019-01-30 NOTE — Telephone Encounter (Signed)
Pt dropped off STD form [facility] [employer]) to be completed by the provider.  Upon completion pt would like for it to be faxed to  [phone] attn: Laurence Compton.  Form was placed in providers folder for completion.

## 2019-01-31 ENCOUNTER — Ambulatory Visit (HOSPITAL_COMMUNITY): Payer: BLUE CROSS/BLUE SHIELD

## 2019-01-31 ENCOUNTER — Encounter (HOSPITAL_COMMUNITY): Payer: Self-pay

## 2019-01-31 DIAGNOSIS — M6281 Muscle weakness (generalized): Secondary | ICD-10-CM

## 2019-01-31 DIAGNOSIS — M5412 Radiculopathy, cervical region: Secondary | ICD-10-CM | POA: Diagnosis not present

## 2019-01-31 DIAGNOSIS — R29818 Other symptoms and signs involving the nervous system: Secondary | ICD-10-CM | POA: Diagnosis not present

## 2019-01-31 NOTE — Therapy (Signed)
[facility] [address] [place], [place], [address] Phone: [phone]   Fax:  [phone]  Physical Therapy Treatment  Patient Details  Name: SOLMON BOHR MRN: [id number] Date of Birth: Oct 19, 1956 Referring Provider (PT): Lavada Mesi, MD   Encounter Date: 01/31/2019  PT End of Session - 01/31/19 1443    Visit Number  3    Number of Visits  12    Date for PT Re-Evaluation  02/23/19    Authorization Type  [employer] OTher (no auth required, no visit limit)    Authorization Time Period  01/26/2019 - 02/23/2019    PT Start Time  1434    PT Stop Time  1518    PT Time Calculation (min)  44 min    Activity Tolerance  Patient tolerated treatment well;Patient limited by pain;No increased pain    Behavior During Therapy  WFL for tasks assessed/performed       Past Medical History:  Diagnosis Date  . Concussion 08-2007  . Erectile dysfunction   . Fracture of left hand   . Hypertension     Past Surgical History:  Procedure Laterality Date  . COLONOSCOPY  12-03-14   per Dr. Marina Goodell, diverticulosis only, repeat in 10 yrs  . LEFT HEART CATHETERIZATION WITH CORONARY ANGIOGRAM N/A 10/22/2014   Procedure: LEFT HEART CATHETERIZATION WITH CORONARY ANGIOGRAM;  Surgeon: Runell Gess, MD;  Location: [facility] CATH LAB;  Service: Cardiovascular;  Laterality: N/A;    There were no vitals filed for this visit.  Subjective Assessment - 01/31/19 1434    Subjective  Pt reports he has some deep pain Rt shoulder from neck, current pain scale 4-5/10.    Patient Stated Goals  to get back to work without shoulder pain    Currently in Pain?  Yes    Pain Score  5     Pain Location  Shoulder    Pain Orientation  Right    Pain Descriptors / Indicators  --   Deep   Pain Type  Acute pain    Pain Radiating Towards  radiates occasionally into Rt thumb    Pain Onset  1 to 4 weeks ago    Pain Frequency  Intermittent    Aggravating Factors   having his arm hand  down at side    Pain Relieving Factors  raising his arm OH    Effect of Pain on Daily Activities  increases                       [facility] Adult PT Treatment/Exercise - 01/31/19 0001      Neck Exercises: Standing   Upper Extremity Flexion with Stabilization  Flexion;10 reps    UE Flexion with Stabilization Limitations  back to wall with UE flexion    Other Standing Exercises  wback 10x      Neck Exercises: Seated   Neck Retraction  10 reps    Neck Retraction Limitations  min cues for form    Other Seated Exercise  cervical SNAG + R rotation 15reps x3" holds      Neck Exercises: Supine   Neck Retraction  10 reps;3 secs      Manual Therapy   Manual Therapy  Soft tissue mobilization;Manual Traction;Passive ROM    Manual therapy comments  Manual complete separate than rest of tx    Soft tissue mobilization  UT, levator    Passive ROM  Cervical rotation as able  Manual Traction  2x 30"             PT Education - 01/31/19 1525    Education Details  Pt educated with importance of posture to address pain and radicular symtpoms.    Person(s) Educated  Patient    Methods  Explanation;Demonstration    Comprehension  Verbalized understanding;Returned demonstration       PT Short Term Goals - 01/26/19 1754      PT SHORT TERM GOAL #1   Title  Patient will be independent with HEP, updated PRN, to improve mobility in cervical spine and functional strength.    Time  2    Period  Weeks    Status  New    Target Date  02/09/19        PT Long Term Goals - 01/26/19 1754      PT LONG TERM GOAL #1   Title  Patient will improve limited muscle groups on Rt UE by 1/2 grade to indicate improvement in funcitonal strength.     Time  4    Period  Weeks    Status  New    Target Date  02/23/19      PT LONG TERM GOAL #2   Title  Patient will improve Rt cervical rotation by 8 degrees or more to indicate significant improvement in joint mobility.    Time  4    Period   Weeks    Status  New      PT LONG TERM GOAL #3   Title  Patient will no longer experience radicular symptoms below his elbow to indicate reduction in radicular pain.     Time  4    Period  Weeks    Status  New      PT LONG TERM GOAL #4   Title  Patient will be able to tolerate 5 full days of work with no increase in shoulder pain to indicate improved tolerance to activities and reduced limitation.     Time  4    Period  Weeks    Status  New            Plan - 01/31/19 1521    Clinical Impression Statement  Reviewed form with HEP and educated pt wiht importance of posture to assist with pain and radicular symptoms.  Pt presents with forward head, kyphotic thoracic posture and winged scapula.  Reviewed mechanics with cervical retraction wiht tendency to extend neck rather than retract.  Exercise complete in supine position wiht improve form.  Added wback and UE flexion infront of wall for posture strengthening.  EOS wiht manual soft tissue mobilizatin to address restrictions UT and levator as well as manual traction and PROM as tolerated to address radicular symptoms and cervical mobility.      Examination-Participation Restrictions  Driving    Stability/Clinical Decision Making  Evolving/Moderate complexity    Rehab Potential  Good    PT Frequency  3x / week    PT Duration  4 weeks    PT Treatment/Interventions  ADLs/Self Care Home Management;Cryotherapy;Electrical Stimulation;Iontophoresis 4mg /ml Dexamethasone;Moist Heat;Traction;Therapeutic exercise;Neuromuscular re-education;Patient/family education;Manual techniques;Passive range of motion;Dry needling;Taping;Spinal Manipulations;Joint Manipulations    PT Next Visit Plan  continue to assess manual interventions for Rt C-spine PA's, begin lateral glide/opening on Rt c-spine. continue median nerve gliding on Rt UE and functional strengthening and cervical spine retraction.    PT Home Exercise Plan  3/6: cervical retraction, scap  retraction, SNAG +cervical R rotation, repeated L  cervical lateral flexion       Patient will benefit from skilled therapeutic intervention in order to improve the following deficits and impairments:  Decreased activity tolerance, Decreased range of motion, Decreased strength, Impaired sensation, Pain, Postural dysfunction, Impaired UE functional use  Visit Diagnosis: Radiculopathy, cervical region  Muscle weakness (generalized)  Other symptoms and signs involving the nervous system     Problem List Patient Active Problem List   Diagnosis Date Noted  . PAD (peripheral artery disease) (HCC) 11/06/2014  . Chest pain 10/22/2014  . Essential hypertension 10/22/2014  . Nicotine abuse 10/22/2014  . GERD (gastroesophageal reflux disease) 10/22/2014  . Hyponatremia 10/22/2014  . CONTUSION OF HAND 06/30/2010  . BENIGN PROSTATIC HYPERTROPHY, WITH OBSTRUCTION 01/03/2010  . CONTUSION, HEAD 09/12/2007  . LUMBAR STRAIN, ACUTE 08/02/2007  . WARTS, OTHER SPECIFIED VIRAL 07/01/2007  . CHICKENPOX, HX OF 07/01/2007   Becky Sax, LPTA; CBIS [phone]  Juel Burrow 01/31/2019, 3:26 PM  [facility] [address] [place], [place], [address] Phone: [phone]   Fax:  [phone]  Name: KINNITH OMDAHL MRN: [id number] Date of Birth: [birth year]

## 2019-02-01 ENCOUNTER — Other Ambulatory Visit: Payer: Self-pay

## 2019-02-01 ENCOUNTER — Ambulatory Visit (HOSPITAL_COMMUNITY): Payer: BLUE CROSS/BLUE SHIELD | Admitting: Physical Therapy

## 2019-02-01 DIAGNOSIS — R29818 Other symptoms and signs involving the nervous system: Secondary | ICD-10-CM | POA: Diagnosis not present

## 2019-02-01 DIAGNOSIS — M6281 Muscle weakness (generalized): Secondary | ICD-10-CM | POA: Diagnosis not present

## 2019-02-01 DIAGNOSIS — M5412 Radiculopathy, cervical region: Secondary | ICD-10-CM | POA: Diagnosis not present

## 2019-02-01 NOTE — Therapy (Signed)
[facility] [address] [place], [place], [address] Phone: [phone]   Fax:  [phone]  Physical Therapy Treatment  Patient Details  Name: [name] MRN: [id number] Date of Birth: March 03, 1956 Referring Provider (PT): Lavada Mesi, MD   Encounter Date: 02/01/2019  PT End of Session - 02/01/19 1323    Visit Number  4    Number of Visits  12    Date for PT Re-Evaluation  02/23/19    Authorization Type  [employer]BS OTher (no auth required, no visit limit)    Authorization Time Period  01/26/2019 - 02/23/2019    PT Start Time  5670    PT Stop Time  0900    PT Time Calculation (min)  43 min    Activity Tolerance  Patient tolerated treatment well;Patient limited by pain;No increased pain    Behavior During Therapy  WFL for tasks assessed/performed       Past Medical History:  Diagnosis Date  . Concussion 08-2007  . Erectile dysfunction   . Fracture of left hand   . Hypertension     Past Surgical History:  Procedure Laterality Date  . COLONOSCOPY  12-03-14   per Dr. Marina Goodell, diverticulosis only, repeat in 10 yrs  . LEFT HEART CATHETERIZATION WITH CORONARY ANGIOGRAM N/A 10/22/2014   Procedure: LEFT HEART CATHETERIZATION WITH CORONARY ANGIOGRAM;  Surgeon: Runell Gess, MD;  Location: [facility] CATH LAB;  Service: Cardiovascular;  Laterality: N/A;    There were no vitals filed for this visit.  Subjective Assessment - 02/01/19 0823    Subjective  Pt reports he still  has some deep pain Rt shoulder from neck.  Pain remains 4-5/10.    Currently in Pain?  Yes    Pain Score  5     Pain Location  Shoulder    Pain Orientation  Right                       [facility] Adult PT Treatment/Exercise - 02/01/19 0001      Neck Exercises: Machines for Strengthening   UBE (Upper Arm Bike)  3 minutes backward warm up      Neck Exercises: Theraband   Scapula Retraction  10 reps;Red    Shoulder Extension  10 reps;Red    Rows  10  reps;Red      Neck Exercises: Standing   Upper Extremity Flexion with Stabilization  Flexion;10 reps    UE Flexion with Stabilization Limitations  back to wall with UE flexion    Other Standing Exercises  wback 10x      Neck Exercises: Supine   Neck Retraction  10 reps;3 secs      Manual Therapy   Manual Therapy  Soft tissue mobilization;Manual Traction;Passive ROM    Manual therapy comments  Manual complete separate than rest of tx    Soft tissue mobilization  UT, levator    Passive ROM  Cervical rotation as able    Manual Traction  4x 30"               PT Short Term Goals - 01/26/19 1754      PT SHORT TERM GOAL #1   Title  Patient will be independent with HEP, updated PRN, to improve mobility in cervical spine and functional strength.    Time  2    Period  Weeks    Status  New    Target Date  02/09/19  PT Long Term Goals - 01/26/19 1754      PT LONG TERM GOAL #1   Title  Patient will improve limited muscle groups on Rt UE by 1/2 grade to indicate improvement in funcitonal strength.     Time  4    Period  Weeks    Status  New    Target Date  02/23/19      PT LONG TERM GOAL #2   Title  Patient will improve Rt cervical rotation by 8 degrees or more to indicate significant improvement in joint mobility.    Time  4    Period  Weeks    Status  New      PT LONG TERM GOAL #3   Title  Patient will no longer experience radicular symptoms below his elbow to indicate reduction in radicular pain.     Time  4    Period  Weeks    Status  New      PT LONG TERM GOAL #4   Title  Patient will be able to tolerate 5 full days of work with no increase in shoulder pain to indicate improved tolerance to activities and reduced limitation.     Time  4    Period  Weeks    Status  New            Plan - 02/01/19 1324    Clinical Impression Statement  continued with established therex and added postural strengthening exercises without pain reported.  Pt did require  cues throughout session to improve upright posturing as tends to go into forward head posturing. Completed manual at end of session with large spasm in Rt UT, resolved 75% at end of session with reduced pain voiced.  No significant improvement during or following manual traction in supine.      Examination-Participation Restrictions  Driving    Stability/Clinical Decision Making  Evolving/Moderate complexity    Rehab Potential  Good    PT Frequency  3x / week    PT Duration  4 weeks    PT Treatment/Interventions  ADLs/Self Care Home Management;Cryotherapy;Electrical Stimulation;Iontophoresis /ml Dexamethasone;Moist Heat;Traction;Therapeutic exercise;Neuromuscular re-education;Patient/family education;Manual techniques;Passive range of motion;Dry needling;Taping;Spinal Manipulations;Joint Manipulations    PT Next Visit Plan  continue to assess manual interventions for Rt C-spine PA's, begin lateral glide/opening on Rt c-spine. Progress exercises as tolerated.      PT Home Exercise Plan  3/6: cervical retraction, scap retraction, SNAG +cervical R rotation, repeated L cervical lateral flexion       Patient will benefit from skilled therapeutic intervention in order to improve the following deficits and impairments:  Decreased activity tolerance, Decreased range of motion, Decreased strength, Impaired sensation, Pain, Postural dysfunction, Impaired UE functional use  Visit Diagnosis: Radiculopathy, cervical region  Muscle weakness (generalized)  Other symptoms and signs involving the nervous system     Problem List Patient Active Problem List   Diagnosis Date Noted  . PAD (peripheral artery disease) (HCC) 11/06/2014  . Chest pain 10/22/2014  . Essential hypertension 10/22/2014  . Nicotine abuse 10/22/2014  . GERD (gastroesophageal reflux disease) 10/22/2014  . Hyponatremia 10/22/2014  . CONTUSION OF HAND 06/30/2010  . BENIGN PROSTATIC HYPERTROPHY, WITH OBSTRUCTION 01/03/2010  .  CONTUSION, HEAD 09/12/2007  . LUMBAR STRAIN, ACUTE 08/02/2007  . WARTS, OTHER SPECIFIED VIRAL 07/01/2007  . CHICKENPOX, HX OF 07/01/2007   Lurena Nida, PTA/CLT [phone]  Lurena Nida 02/01/2019, 1:36 PM  [facility] [address]  [address] [place], [place], [address] Phone: [phone]   Fax:  [phone]  Name: [name] MRN: [id number] Date of Birth: [birth year]

## 2019-02-03 ENCOUNTER — Encounter (HOSPITAL_COMMUNITY): Payer: Self-pay

## 2019-02-03 ENCOUNTER — Ambulatory Visit (HOSPITAL_COMMUNITY): Payer: BLUE CROSS/BLUE SHIELD

## 2019-02-03 ENCOUNTER — Encounter: Payer: Self-pay | Admitting: Family Medicine

## 2019-02-03 ENCOUNTER — Other Ambulatory Visit: Payer: Self-pay

## 2019-02-03 DIAGNOSIS — M5412 Radiculopathy, cervical region: Secondary | ICD-10-CM | POA: Diagnosis not present

## 2019-02-03 DIAGNOSIS — Z0279 Encounter for issue of other medical certificate: Secondary | ICD-10-CM

## 2019-02-03 DIAGNOSIS — R29818 Other symptoms and signs involving the nervous system: Secondary | ICD-10-CM | POA: Diagnosis not present

## 2019-02-03 DIAGNOSIS — M6281 Muscle weakness (generalized): Secondary | ICD-10-CM | POA: Diagnosis not present

## 2019-02-03 NOTE — Therapy (Signed)
[facility] [address] [place], [place], [address] Phone: [phone]   Fax:  [phone]  Physical Therapy Treatment  Patient Details  Name: [name] MRN: [id number] Date of Birth: 1956-04-19 Referring Provider ([name]): Lavada Mesi, MD   Encounter Date: 02/03/2019  [name] End of Session - 02/03/19 0854    Visit Number  5    Number of Visits  12    Date for [name] Re-Evaluation  02/23/19    Authorization Type  [employer] (no auth required, no visit limit)    Authorization Time Period  01/26/2019 - 02/23/2019    [name] Start Time  0855    [name] Stop Time  0936    [name] Time Calculation (min)  41 min    Activity Tolerance  Patient tolerated treatment well;Patient limited by pain;No increased pain    Behavior During Therapy  WFL for tasks assessed/performed       Past Medical History:  Diagnosis Date  . Concussion 08-2007  . Erectile dysfunction   . Fracture of left hand   . Hypertension     Past Surgical History:  Procedure Laterality Date  . COLONOSCOPY  12-03-14   per Dr. Marina Goodell, diverticulosis only, repeat in 10 yrs  . LEFT HEART CATHETERIZATION WITH CORONARY ANGIOGRAM N/A 10/22/2014   Procedure: LEFT HEART CATHETERIZATION WITH CORONARY ANGIOGRAM;  Surgeon: Runell Gess, MD;  Location: [facility] CATH LAB;  Service: Cardiovascular;  Laterality: N/A;    There were no vitals filed for this visit.  Subjective Assessment - 02/03/19 0854    Subjective  [name] states that his pain is coming and going. Having tingling down into R thumb today. Feels slightly better stating that his pain is now intermittent versus constant throb.    Currently in Pain?  No/denies           [facility] Adult [name] Treatment/Exercise - 02/03/19 0001      Neck Exercises: Theraband   Scapula Retraction  15 reps;Green    Scapula Retraction Limitations  2 sets    Shoulder Extension  15 reps;Green    Shoulder Extension Limitations  2 sets      Neck Exercises: Seated   Neck Retraction  15 reps      Manual Therapy   Manual Therapy  Joint mobilization;Manual Traction;Myofascial release    Manual therapy comments  Manual complete separate than rest of tx    Joint Mobilization  joint mobs to C5-7 increased R shoulder radicular pain and tingling; unilateral mobs did not change symptoms; lateral glides to the R with [name] in supine no change    Myofascial Release  MFR to R uper trap, levator scap to reduce restrictions    Manual Traction  supine cervical distraction x8 mins total - reduced pain and radicular symptoms      Neck Exercises: Stretches   Upper Trapezius Stretch  Right;Left;2 reps;30 seconds    Levator Stretch  Right;Left;2 reps;30 seconds    Other Neck Stretches  pec stretch in doorway 3x30"             [name] Education - 02/03/19 0854    Education Details  exercise technique, updated HEP    Person(s) Educated  Patient    Methods  Explanation;Demonstration;Handout    Comprehension  Verbalized understanding;Returned demonstration       [name] Short Term Goals - 01/26/19 1754      [name] SHORT TERM GOAL #1   Title  Patient will be independent with  HEP, updated PRN, to improve mobility in cervical spine and functional strength.    Time  2    Period  Weeks    Status  New    Target Date  02/09/19        [name] Long Term Goals - 01/26/19 1754      [name] LONG TERM GOAL #1   Title  Patient will improve limited muscle groups on Rt UE by 1/2 grade to indicate improvement in funcitonal strength.     Time  4    Period  Weeks    Status  New    Target Date  02/23/19      [name] LONG TERM GOAL #2   Title  Patient will improve Rt cervical rotation by 8 degrees or more to indicate significant improvement in joint mobility.    Time  4    Period  Weeks    Status  New      [name] LONG TERM GOAL #3   Title  Patient will no longer experience radicular symptoms below his elbow to indicate reduction in radicular pain.     Time  4    Period  Weeks    Status  New       [name] LONG TERM GOAL #4   Title  Patient will be able to tolerate 5 full days of work with no increase in shoulder pain to indicate improved tolerance to activities and reduced limitation.     Time  4    Period  Weeks    Status  New            Plan - 02/03/19 0939    Clinical Impression Statement  [name] continues to present to therapy with continued c/o pain, but does state he thinks it is slightly better. Continued with postural strengthening, cervical mobility, and reducing pain. Added pec stretch for reduced scapular anterior tilt and improved thoracic mobility. Progressed to GTB for posture strength. Cervical joint mobs continue to increased radicular pain and tingling so performed manual cervical distraction/traction with [name] in supine and this reduced radicular symptoms and pain. Recommend initiating cervical traction on machine next visit for more increased traction force to facilitate improved joint space and reduced nerve irritation.    Examination-Participation Restrictions  Driving    Stability/Clinical Decision Making  Evolving/Moderate complexity    Rehab Potential  Good    [name] Frequency  3x / week    [name] Duration  4 weeks    [name] Treatment/Interventions  ADLs/Self Care Home Management;Cryotherapy;Electrical Stimulation;Iontophoresis 4mg /ml Dexamethasone;Moist Heat;Traction;Therapeutic exercise;Neuromuscular re-education;Patient/family education;Manual techniques;Passive range of motion;Dry needling;Taping;Spinal Manipulations;Joint Manipulations    [name] Next Visit Plan  begin cerivcal traction machine; continue to assess soft tissue involvement; hold CPAs and joint mobs in prone since irritates pain; potentially consider continuing lateral glide/opening on Rt c-spine. Progress exercises as tolerated.      [name] Home Exercise Plan  3/6: cervical retraction, scap retraction, SNAG +cervical R rotation, repeated L cervical lateral flexion; 3/13: pec stretch in doorway, scap retraction, GH extension  GTB    Consulted and Agree with Plan of Care  Patient       Patient will benefit from skilled therapeutic intervention in order to improve the following deficits and impairments:  Decreased activity tolerance, Decreased range of motion, Decreased strength, Impaired sensation, Pain, Postural dysfunction, Impaired UE functional use  Visit Diagnosis: Radiculopathy, cervical region  Muscle weakness (generalized)  Other symptoms and signs involving the nervous system     Problem  List Patient Active Problem List   Diagnosis Date Noted  . PAD (peripheral artery disease) (HCC) 11/06/2014  . Chest pain 10/22/2014  . Essential hypertension 10/22/2014  . Nicotine abuse 10/22/2014  . GERD (gastroesophageal reflux disease) 10/22/2014  . Hyponatremia 10/22/2014  . CONTUSION OF HAND 06/30/2010  . BENIGN PROSTATIC HYPERTROPHY, WITH OBSTRUCTION 01/03/2010  . CONTUSION, HEAD 09/12/2007  . LUMBAR STRAIN, ACUTE 08/02/2007  . WARTS, OTHER SPECIFIED VIRAL 07/01/2007  . CHICKENPOX, HX OF 07/01/2007         [name], [name]    [facility] [address] [place], [place], [address] Phone: [phone]   Fax:  [phone]  Name: [name] MRN: [id number] Date of Birth: 05-Mar-1956

## 2019-02-06 ENCOUNTER — Ambulatory Visit (HOSPITAL_COMMUNITY): Payer: BLUE CROSS/BLUE SHIELD

## 2019-02-06 ENCOUNTER — Telehealth (INDEPENDENT_AMBULATORY_CARE_PROVIDER_SITE_OTHER): Payer: Self-pay | Admitting: Family Medicine

## 2019-02-06 ENCOUNTER — Telehealth (HOSPITAL_COMMUNITY): Payer: Self-pay

## 2019-02-06 DIAGNOSIS — M5410 Radiculopathy, site unspecified: Secondary | ICD-10-CM

## 2019-02-06 DIAGNOSIS — M542 Cervicalgia: Secondary | ICD-10-CM

## 2019-02-06 NOTE — Telephone Encounter (Signed)
MRI ordered

## 2019-02-06 NOTE — Telephone Encounter (Signed)
I called: the patient finished his Prednisone 2 days ago, still taking Tizanidine prn. Has been to PT x 5 - "seems to hurt more than help." He said he no longer has the constant throbbing pain, but does have an aching pain, with tingling in the shoulder into the thumb. He has another PT appointment today at 67, but is unsure about going to it. Is MRI the next step (lives near [facility])?

## 2019-02-06 NOTE — Telephone Encounter (Signed)
This is the second request/ Pt says insurance has not received. Please send

## 2019-02-06 NOTE — Telephone Encounter (Signed)
Just talked to MD office they will schedule a MRI and MD office said for him not to come to PT today

## 2019-02-06 NOTE — Telephone Encounter (Signed)
I called and advised the patient that an MRI has been ordered.  He said he is not opposed to driving into [place] to have the MRI, if needed. He cancelled his PT appointment for this morning - on hold until we have results of the MRI.

## 2019-02-06 NOTE — Telephone Encounter (Signed)
Form has been faxed back as requested.

## 2019-02-06 NOTE — Telephone Encounter (Signed)
Patient called advised he has been in (PT) 5 days and it is not working. Patient asked what else can be done for his neck and shoulder. The number to contact patient is [phone]

## 2019-02-07 ENCOUNTER — Telehealth (HOSPITAL_COMMUNITY): Payer: Self-pay

## 2019-02-07 NOTE — Telephone Encounter (Signed)
Pt waiting on MD to call with apptment for MRI before he continues PT

## 2019-02-08 ENCOUNTER — Ambulatory Visit (HOSPITAL_COMMUNITY): Payer: BLUE CROSS/BLUE SHIELD

## 2019-02-09 ENCOUNTER — Other Ambulatory Visit: Payer: Self-pay

## 2019-02-09 ENCOUNTER — Ambulatory Visit
Admission: RE | Admit: 2019-02-09 | Discharge: 2019-02-09 | Disposition: A | Discharge status: Home / Self Care | Payer: BLUE CROSS/BLUE SHIELD | Source: Ambulatory Visit | Attending: Family Medicine | Admitting: Family Medicine

## 2019-02-09 DIAGNOSIS — M542 Cervicalgia: Secondary | ICD-10-CM

## 2019-02-09 DIAGNOSIS — M4802 Spinal stenosis, cervical region: Secondary | ICD-10-CM | POA: Diagnosis not present

## 2019-02-10 ENCOUNTER — Telehealth (INDEPENDENT_AMBULATORY_CARE_PROVIDER_SITE_OTHER): Payer: Self-pay | Admitting: Family Medicine

## 2019-02-10 ENCOUNTER — Telehealth (HOSPITAL_COMMUNITY): Payer: Self-pay

## 2019-02-10 ENCOUNTER — Ambulatory Visit (HOSPITAL_COMMUNITY): Payer: BLUE CROSS/BLUE SHIELD

## 2019-02-10 ENCOUNTER — Encounter (HOSPITAL_COMMUNITY): Payer: Self-pay

## 2019-02-10 DIAGNOSIS — M5410 Radiculopathy, site unspecified: Secondary | ICD-10-CM

## 2019-02-10 DIAGNOSIS — M542 Cervicalgia: Secondary | ICD-10-CM

## 2019-02-10 NOTE — Telephone Encounter (Signed)
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  It is the result of disc bulging and bone spurring.  This is the most likely cause of the pain.  If symptoms are not any better, we could try to get an epidural steroid injection arranged.  If that did not help, then possibly surgical consult depending on the coronavirus situation.

## 2019-02-10 NOTE — Telephone Encounter (Signed)
I called Mr. [name] at his home to discuss that our office is closing for 2 weeks for all patients and staffs safety during the COVID 19 spread. He stated he has been following up with his MD and had an MRI yesterday and is waiting for the results. He requested to be discharged until he follows up with them and will return if needed.   Valentino Saxon, PT, DPT, [facility] Physical Therapist with  [facility]  02/10/2019 11:55 AM

## 2019-02-10 NOTE — Therapy (Signed)
[place], [place], [address] Phone: [phone]   Fax:  [phone]  Patient Details  Name: [name] MRN: [id number] Date of Birth: Jan 10, 1956 Referring Provider:  No ref. provider found  Encounter Date: 02/10/2019   PHYSICAL THERAPY DISCHARGE SUMMARY  Visits from Start of Care: 5  Current functional level related to goals / functional outcomes: Patient has requested to be discharged at this time as he is following up with his physicians and is waiting on MRI results for his neck. I will resolve his episode. He was educated that he can return to physical therapy with a new referral if he feels the need.   Remaining deficits: See last treatment note.   Education / Equipment: Educated on need for new referral to return. Educated that our office is closing for 2 weeks to protect patients and staffs well being during [place] spread.  Plan: Patient agrees to discharge.  Patient goals were not met. Patient is being discharged due to the patient's request.  ?????     Kipp Brood, PT, DPT, [facility] Physical Therapist with [facility]  02/10/2019 11:55 AM    [place], [place], [address] Phone: [phone]   Fax:  [phone]

## 2019-02-13 ENCOUNTER — Encounter (INDEPENDENT_AMBULATORY_CARE_PROVIDER_SITE_OTHER): Payer: Self-pay

## 2019-02-13 ENCOUNTER — Telehealth (INDEPENDENT_AMBULATORY_CARE_PROVIDER_SITE_OTHER): Payer: Self-pay | Admitting: Family Medicine

## 2019-02-13 ENCOUNTER — Ambulatory Visit (HOSPITAL_COMMUNITY): Payer: BLUE CROSS/BLUE SHIELD

## 2019-02-13 NOTE — Telephone Encounter (Signed)
Yes, that would be fine.  Ok to give OOW note until able to have injection.

## 2019-02-13 NOTE — Telephone Encounter (Signed)
Received a request for records from [facility]. Unable to process as we do not have an authorization to release. Advised need to fax AR

## 2019-02-13 NOTE — Telephone Encounter (Signed)
Orders placed for ESI.

## 2019-02-13 NOTE — Addendum Note (Signed)
Addended by: Lillia Carmel on: 02/13/2019 10:06 AM   Modules accepted: Orders

## 2019-02-13 NOTE — Telephone Encounter (Signed)
I advised the patient that one of Dr. [place] Blas schedulers would be calling him to schedule the ESI.  After talking with Dr. Alvester Morin today, the timeframe for this would be in around 4 weeks at least (week of 4/20 or 4/27). 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  He is currently out of work from his PCP.  He is asking if this could/should be extended until after he has the [facility]. Please advise.

## 2019-02-13 NOTE — Telephone Encounter (Signed)
I called the patient: he did read the results in MyChart. I further explained them to him.  He said the PT facility is closed for 2 weeks.  He did not feel like it was helping him, anyway.  He said some of the exercises involving his head made the pain worse, so he is not doing his home exercises, either.  He is feeling some better, but still has an ache in his shoulder with some tingling.  He is taking Aleve regularly - this helps.  If the ESI will help, he would like to have this done.  He is asking that we call him back, instead of using MyChart, because his internet is very slow where he lives.

## 2019-02-13 NOTE — Telephone Encounter (Signed)
Note written to be out of work until 03/20/2019. Hopefully, he can have the ESI by this time. If we need to amend this in the future, we can do that. Mailing the letter to the patient's home address.  I left all of this information on the patient's mobile voice mail.

## 2019-02-15 ENCOUNTER — Ambulatory Visit (HOSPITAL_COMMUNITY): Payer: BLUE CROSS/BLUE SHIELD

## 2019-02-17 ENCOUNTER — Ambulatory Visit (HOSPITAL_COMMUNITY): Payer: BLUE CROSS/BLUE SHIELD

## 2019-02-17 ENCOUNTER — Telehealth: Payer: Self-pay | Admitting: Family Medicine

## 2019-02-17 MED ORDER — DOXYCYCLINE HYCLATE 100 MG PO TABS: 100.0000 mg | ORAL_TABLET | Freq: Two times a day (BID) | ORAL | 2 refills | Status: DC

## 2019-02-17 NOTE — Telephone Encounter (Signed)
Copied from CRM [id number]. Topic: Quick Communication - Rx Refill/Question >> Feb 17, 2019  9:10 AM Burchel, Abbi R wrote: Medication:  doxycycline (VIBRA-TABS) 100 MG tablet   Preferred Pharmacy: [facility] [address] [place] - [address]  [phone] (Phone) [phone] (Fax)  Pt states that he was prescribed this medication for boils, and seems to have reoccurrence of the same situation and would like a refill.  Please advise.   Pt was advised that RX refills may take up to 3 business days. We ask that you follow-up with your pharmacy.

## 2019-02-17 NOTE — Telephone Encounter (Signed)
Call in #20 with 2 rf

## 2019-02-17 NOTE — Telephone Encounter (Signed)
Rx done. 

## 2019-02-20 ENCOUNTER — Encounter (HOSPITAL_COMMUNITY): Payer: BLUE CROSS/BLUE SHIELD

## 2019-02-22 ENCOUNTER — Encounter (HOSPITAL_COMMUNITY): Payer: BLUE CROSS/BLUE SHIELD

## 2019-02-24 ENCOUNTER — Encounter (HOSPITAL_COMMUNITY): Payer: BLUE CROSS/BLUE SHIELD

## 2019-02-27 ENCOUNTER — Telehealth (INDEPENDENT_AMBULATORY_CARE_PROVIDER_SITE_OTHER): Payer: Self-pay

## 2019-02-27 ENCOUNTER — Encounter (INDEPENDENT_AMBULATORY_CARE_PROVIDER_SITE_OTHER): Payer: Self-pay | Admitting: Radiology

## 2019-02-27 NOTE — Telephone Encounter (Signed)
Ok to give note until 04-12-19.

## 2019-02-27 NOTE — Telephone Encounter (Signed)
Note sent through MyChart. Will call patient to see if he want to pick up copy.

## 2019-02-27 NOTE — Telephone Encounter (Signed)
Please advise 

## 2019-02-27 NOTE — Telephone Encounter (Signed)
Pt is in need of his out of work note to be extended. Current note has him out of work until 03/20/19 but he is not scheduled to see Dr. Alvester Morin for injection until 04/10/19. Can he get note extending him out past the injection date.

## 2019-02-28 ENCOUNTER — Telehealth (INDEPENDENT_AMBULATORY_CARE_PROVIDER_SITE_OTHER): Payer: Self-pay | Admitting: Family Medicine

## 2019-02-28 NOTE — Telephone Encounter (Signed)
Faxed to provided number  

## 2019-02-28 NOTE — Telephone Encounter (Signed)
Please advise 

## 2019-02-28 NOTE — Telephone Encounter (Signed)
Ok to fax results

## 2019-02-28 NOTE — Telephone Encounter (Signed)
Patient called stating that [facility] needs the clinical results of his MRI faxed to them at [phone], please refer to reference [id number].  Patient's CB#[phone].  Thank you.

## 2019-03-09 ENCOUNTER — Telehealth (INDEPENDENT_AMBULATORY_CARE_PROVIDER_SITE_OTHER): Payer: Self-pay | Admitting: Family Medicine

## 2019-03-09 NOTE — Telephone Encounter (Signed)
Patient called, the work note from 02/27/2019 states patient was seen in clinic that day but, he was not seen-he was only given a note. Please amend the work note, needs to state he remains OOW since last seen and that is scheduled 5/18 with Dr. Alvester Morin for an injection.  Fax to [employer] at [phone] claim# [id number], patient asks for call when this is done [phone]

## 2019-03-10 ENCOUNTER — Encounter (INDEPENDENT_AMBULATORY_CARE_PROVIDER_SITE_OTHER): Payer: Self-pay

## 2019-03-10 NOTE — Telephone Encounter (Signed)
New note has been written and faxed - patient advised.

## 2019-03-14 ENCOUNTER — Other Ambulatory Visit: Payer: Self-pay

## 2019-03-14 ENCOUNTER — Telehealth: Payer: Self-pay | Admitting: *Deleted

## 2019-03-14 ENCOUNTER — Ambulatory Visit: Payer: BLUE CROSS/BLUE SHIELD | Admitting: Family Medicine

## 2019-03-14 ENCOUNTER — Encounter: Payer: Self-pay | Admitting: Family Medicine

## 2019-03-14 VITALS — BP 140/80 | HR 69 | Temp 98.2°F | Wt 161.0 lb

## 2019-03-14 DIAGNOSIS — L0293 Carbuncle, unspecified: Secondary | ICD-10-CM | POA: Diagnosis not present

## 2019-03-14 MED ORDER — DOXYCYCLINE HYCLATE 100 MG PO TABS: 100.0000 mg | ORAL_TABLET | Freq: Two times a day (BID) | ORAL | 1 refills | Status: DC

## 2019-03-14 NOTE — Progress Notes (Signed)
   Subjective:    Patient ID: [name], male    DOB: March 20, 1956, 63 y.o.   MRN: [id number]  HPI Here for recurrent boils. He was here in February for a boil on the scrotum. A culture of the fluid collected grew MSSA and it responded well to a 10 day course of Doxycycline. He has been taking this on and off for several more boils, and today he has a large on on the left calf. No fevers.    Review of Systems  Constitutional: Negative.   Respiratory: Negative.   Cardiovascular: Negative.        Objective:   Physical Exam Constitutional:      Appearance: Normal appearance.  Cardiovascular:     Rate and Rhythm: Normal rate and regular rhythm.     Pulses: Normal pulses.     Heart sounds: Normal heart sounds.  Pulmonary:     Effort: Pulmonary effort is normal.     Breath sounds: Normal breath sounds.  Skin:    Comments: Left calf has a large tender boil which has purulent fluid inside. This was expressed out today  Neurological:     Mental Status: He is alert.           Assessment & Plan:  Recurrent boils. He will start on Doxycycline BID for a straight 6 month period. At the end of this we will reassess.  Gershon Crane, MD

## 2019-03-14 NOTE — Telephone Encounter (Signed)
Questions for Screening COVID-19  Symptom onset: 3 weeks sx of Boils   Travel or Contacts: No   During this illness, did/does the patient experience any of the following symptoms? Fever >100.82F []   Yes [x]   No []   Unknown Subjective fever (felt feverish) []   Yes [x]   No []   Unknown Chills []   Yes [x]   No []   Unknown Muscle aches (myalgia) [x]   Yes []   No []   Unknown Just left Calf  Runny nose (rhinorrhea) []   Yes [x]   No []   Unknown Sore throat []   Yes [x]   No []   Unknown Cough (new onset or worsening of chronic cough) []   Yes [x]   No []   Unknown Shortness of breath (dyspnea) []   Yes [x]   No []   Unknown Nausea or vomiting []   Yes [x]   No []   Unknown Headache []   Yes [x]   No []   Unknown Abdominal pain  []   Yes [x]   No []   Unknown Diarrhea (?3 loose/looser than normal stools/24hr period) []   Yes [x]   No []   Unknown Other, specify:  Patient risk factors: Smoker? [x]   Current []   Former []   Never If male, currently pregnant? []   Yes []   No  Patient Active Problem List   Diagnosis Date Noted  . Cervical radiculopathy 02/03/2019  . PAD (peripheral artery disease) (HCC) 11/06/2014  . Chest pain 10/22/2014  . Essential hypertension 10/22/2014  . Nicotine abuse 10/22/2014  . GERD (gastroesophageal reflux disease) 10/22/2014  . Hyponatremia 10/22/2014  . CONTUSION OF HAND 06/30/2010  . BENIGN PROSTATIC HYPERTROPHY, WITH OBSTRUCTION 01/03/2010  . CONTUSION, HEAD 09/12/2007  . LUMBAR STRAIN, ACUTE 08/02/2007  . WARTS, OTHER SPECIFIED VIRAL 07/01/2007  . CHICKENPOX, HX OF 07/01/2007    Plan:   No risk patient can be seen in office.  Note: Referral to telemedicine is an appropriate alternative disposition for higher risk but stable. [facility] Tele[facility]/e-Visit: [phone].

## 2019-03-27 ENCOUNTER — Telehealth: Payer: Self-pay | Admitting: Family Medicine

## 2019-03-27 NOTE — Telephone Encounter (Signed)
Copied from CRM [id number]. Topic: Quick Communication - Rx Refill/Question >> Mar 27, 2019 10:22 AM Baldo Daub L wrote: Medication:  doxycycline (VIBRA-TABS) 100 MG tablet  Pt states that he can't take this because it is sun sensitive and he is an outdoors type.  This medication is making his skin burn even with sunscreen. Pt can be reached at [phone]

## 2019-03-28 ENCOUNTER — Encounter (INDEPENDENT_AMBULATORY_CARE_PROVIDER_SITE_OTHER): Payer: Self-pay | Admitting: Physical Medicine and Rehabilitation

## 2019-03-30 NOTE — Telephone Encounter (Signed)
Dr Fry please advise. thanks 

## 2019-03-31 NOTE — Telephone Encounter (Signed)
Stop the Doxycycline and call in Keflex 500 mg TID as needed for boils, #30 with 5 rf

## 2019-03-31 NOTE — Telephone Encounter (Signed)
Patient calling to check and see if Dr Clent Ridges has responded to his message. Advised that we are still waiting for a response from Dr Clent Ridges.  CB#: [phone]

## 2019-04-03 MED ORDER — CEPHALEXIN 500 MG PO CAPS: 500.0000 mg | ORAL_CAPSULE | Freq: Three times a day (TID) | ORAL | 5 refills | Status: DC

## 2019-04-03 NOTE — Telephone Encounter (Signed)
Called and spoke with pt and he is aware of rx that has been sent to the pharmacy for the keflex.

## 2019-04-07 ENCOUNTER — Telehealth: Payer: Self-pay | Admitting: Family Medicine

## 2019-04-07 NOTE — Telephone Encounter (Signed)
Patient is waiting for [name] to call back to reschedule appt.  [phone]

## 2019-04-07 NOTE — Telephone Encounter (Signed)
Copied from CRM [id number]. Topic: Quick Communication - See Telephone Encounter >> Apr 07, 2019 11:36 AM [name] wrote: CRM for notification. See Telephone encounter for: 04/07/19. Patient is calling back to speak to [name] Please advise Thank you

## 2019-04-10 ENCOUNTER — Encounter: Payer: Self-pay | Admitting: Physical Medicine and Rehabilitation

## 2019-04-10 ENCOUNTER — Other Ambulatory Visit: Payer: Self-pay

## 2019-04-10 ENCOUNTER — Ambulatory Visit: Payer: BLUE CROSS/BLUE SHIELD | Admitting: Physical Medicine and Rehabilitation

## 2019-04-10 ENCOUNTER — Encounter: Payer: Self-pay | Admitting: Family Medicine

## 2019-04-10 VITALS — BP 164/100 | HR 62

## 2019-04-10 DIAGNOSIS — L0293 Carbuncle, unspecified: Secondary | ICD-10-CM | POA: Diagnosis not present

## 2019-04-10 DIAGNOSIS — M5010 Cervical disc disorder with radiculopathy, unspecified cervical region: Secondary | ICD-10-CM

## 2019-04-10 DIAGNOSIS — M542 Cervicalgia: Secondary | ICD-10-CM

## 2019-04-10 DIAGNOSIS — L0292 Furuncle, unspecified: Secondary | ICD-10-CM

## 2019-04-10 NOTE — Progress Notes (Signed)
 .  Numeric Pain Rating Scale and Functional Assessment Average Pain 0   In the last MONTH (on 0-10 scale) has pain interfered with the following?  1. General activity like being  able to carry out your everyday physical activities such as walking, climbing stairs, carrying groceries, or moving a chair?  Rating(2)   +Driver, -BT, -Dye Allergies.

## 2019-04-10 NOTE — Progress Notes (Signed)
Pt feeling better, wants to RTW.

## 2019-04-11 ENCOUNTER — Encounter: Payer: Self-pay | Admitting: Physical Medicine and Rehabilitation

## 2019-04-11 NOTE — Progress Notes (Signed)
[name] - 63 y.o. male MRN [id number]  Date of birth: October 01, 1956  Office Visit Note: Visit Date: 04/10/2019 PCP: Nelwyn SalisburyFry, Stephen A, MD Referred by: Nelwyn SalisburyFry, Stephen A, MD  Subjective: Chief Complaint  Patient presents with   Neck - Pain   Right Shoulder - Pain   HPI: [name] is a 63 y.o. male who comes in today At the request of Dr. Casimiro NeedleMichael hilts for evaluation management of chronic worsening severe at times neck and right shoulder and arm pain with paresthesia.  Mr. Orvan FalconerCampbell has an interesting story that in early February he reports that he was just washing his truck 1 day and no real issue no trauma and then that night had severe pain in the neck and shoulder that was awakening him.  He reports since that time he had just worsening symptoms that would go down into the thumb with paresthesia in pretty classic C6 distribution.  He initially saw his primary care physician Dr. Clent RidgesFry at the time he was having more shoulder pain they felt like maybe it was impingement and he did receive Medrol Dosepak and tramadol.  He went on to have some continued medication management and physical therapy.  He was also evaluated in our office by Dr. Casimiro NeedleMichael hilts who did feel like this was more of a radicular pain.  Ultimately MRI of the cervical spine was performed and this is reviewed today with the patient and reviewed below.  He does have significant degenerative spondylosis but without frank central canal stenosis but there is foraminal narrowing bilaterally.  He reports no left side plank.  There was excellent flow of contrast producing a partial arthrogram.  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  He reports worsening symptoms if he lets his arm hanging down or with activity.  He also reports relief of symptoms if he puts his hand up in the air over his head and pushes down.  Interestingly over the last several weeks it has  diminished quite a bit.  Review of Systems  Constitutional: Negative for chills, fever, malaise/fatigue and weight loss.  HENT: Negative for hearing loss and sinus pain.   Eyes: Negative for blurred vision, double vision and photophobia.  Respiratory: Negative for cough and shortness of breath.   Cardiovascular: Negative for chest pain, palpitations and leg swelling.  Gastrointestinal: Negative for abdominal pain, nausea and vomiting.  Genitourinary: Negative for flank pain.  Musculoskeletal: Positive for joint pain and neck pain. Negative for myalgias.       Right radicular pain with paresthesia  Skin: Negative for itching and rash.  Neurological: Positive for tingling. Negative for tremors, focal weakness and weakness.  Endo/Heme/Allergies: Negative.   Psychiatric/Behavioral: Negative for depression.  All other systems reviewed and are negative.  Otherwise per HPI.  Assessment & Plan: Visit Diagnoses:  1. Recurrent boils   2. Cervical disc disorder with radiculopathy   3. Cervicalgia     Plan: Findings:  Clinically seem to have had a classic C6 radiculitis in the right arm.  This went down into the thumb.  MRI evidence would be consistent with that although not specifically consistent.  My experience with foraminal narrowing is sometimes these nerves will get flared up but they are not "pinched".  The foramen seems to close up over a long period of time [name] mainly arthritis and spondylosis.  He is essentially gotten a  lot better over the ensuing weeks.  We obviously had to put him off for any sort of injection and I think we have had a few patients now with just waiting ended up getting somewhat better.  I do not think at this point he needs any interventional treatment.  We talked about the natural history of the cervical spine and nerve root issues.  We talked about because it affected it can just flareup.  At this point I will continue with current exercises and current medication  type treatment.  If it starts to get worse or flares up he will give [place] a call and I do think he would be amenable to cervical epidural injection.  We do not really perform that many nerve root blocks of the cervical spine but would consider that.  He does not have much in the way of any central stenosis and is really had no physical findings I do think this is really a surgical issue.    Meds & Orders: No orders of the defined types were placed in this encounter.  No orders of the defined types were placed in this encounter.   Follow-up: No follow-ups on file.   Procedures: No procedures performed  No notes on file   Clinical History: MRI CERVICAL SPINE WITHOUT CONTRAST  TECHNIQUE: Multiplanar, multisequence MR imaging of the cervical spine was performed. No intravenous contrast was administered.  COMPARISON:  Cervical spine radiographs 01/24/2019  FINDINGS: Alignment: Straightening/slight reversal of the normal cervical lordosis. No listhesis.  Vertebrae: No fracture or suspicious osseous lesion. Chronic degenerative endplate changes in the mid and lower cervical spine, greatest at C6-7. Mild left facet edema at C7-T1.  Cord: Normal signal.  Posterior Fossa, vertebral arteries, paraspinal tissues: Subcentimeter T2 hyperintense nodules in the thyroid. Preserved vertebral artery flow voids.  Disc levels:  Moderate disc space narrowing at C4-5 and C6-7 with milder narrowing at C5-6.  C2-3: Mild left uncovertebral spurring and moderate left facet arthrosis result in mild left neural foraminal stenosis without spinal stenosis.  C3-4: Shallow central disc protrusion without spinal stenosis. Mild-to-moderate left neural foraminal stenosis due to moderate left facet arthrosis.  C4-5: Disc bulging and uncovertebral spurring result in mild spinal stenosis with slight cord flattening and moderate right and mild left neural foraminal stenosis with potential right C5  nerve root impingement.  C5-6: Disc bulging and uncovertebral spurring result in moderate to severe right and moderate left neural foraminal stenosis with potential right C6 nerve root impingement. No spinal stenosis.  C6-7: Disc bulging and uncovertebral spurring result in mild bilateral neural foraminal stenosis without spinal stenosis.  C7-T1: Mild facet arthrosis without disc herniation or stenosis.  IMPRESSION: 1. Moderately advanced cervical disc degeneration. 2. Mild spinal stenosis and moderate right neural foraminal stenosis at C4-5. 3. Moderate to severe right and moderate left neural foraminal stenosis at C5-6.   Electronically Signed   By: Sebastian Ache M.D.   On: 02/09/2019 15:   He reports that he has been smoking cigarettes. He has a 35.00 pack-year smoking history. He has never used smokeless tobacco. No results for input(s): HGBA1C, LABURIC in the last 8760 hours.  Objective:  VS:  HT:     WT:    BMI:      BP:(!) 164/100   HR:62bpm   TEMP: ( )   RESP:  Physical Exam Vitals signs and nursing note reviewed.  Constitutional:      General: He is not in acute distress.  Appearance: Normal appearance. He is well-developed.  HENT:     Head: Normocephalic and atraumatic.  Eyes:     Conjunctiva/sclera: Conjunctivae normal.     Pupils: Pupils are equal, round, and reactive to light.  Neck:     Musculoskeletal: Neck supple. No neck rigidity or muscular tenderness.  Cardiovascular:     Rate and Rhythm: Normal rate.     Pulses: Normal pulses.     Heart sounds: Normal heart sounds.  Pulmonary:     Effort: Pulmonary effort is normal. No respiratory distress.  Musculoskeletal:     Right lower leg: No edema.     Left lower leg: No edema.     Comments: Patient sits with somewhat forward flexed cervical spine with some pain at end ranges of rotation.  No focal trigger points noted.  He has some mild shoulder impingement on the right more than left.  He has good  strength in both upper limbs symmetrically.  Negative Tinel's at the wrist.  Lymphadenopathy:     Cervical: No cervical adenopathy.  Skin:    General: Skin is warm and dry.     Findings: No erythema or rash.  Neurological:     General: No focal deficit present.     Mental Status: He is alert and oriented to person, place, and time.     Sensory: No sensory deficit.     Coordination: Coordination normal.     Gait: Gait normal.  Psychiatric:        Mood and Affect: Mood normal.        Behavior: Behavior normal.     Ortho Exam Imaging: No results found.  Past Medical/Family/Surgical/Social History: Medications & Allergies reviewed per EMR, new medications updated. Patient Active Problem List   Diagnosis Date Noted   Recurrent boils 03/14/2019   Cervical radiculopathy 02/03/2019   PAD (peripheral artery disease) (HCC) 11/06/2014   Chest pain 10/22/2014   Essential hypertension 10/22/2014   Nicotine abuse 10/22/2014   GERD (gastroesophageal reflux disease) 10/22/2014   Hyponatremia 10/22/2014   BENIGN PROSTATIC HYPERTROPHY, WITH OBSTRUCTION 01/03/2010   Past Medical History:  Diagnosis Date   Concussion 08-2007   Erectile dysfunction    Fracture of left hand    Hypertension    Family History  Problem Relation Age of Onset   Arthritis Unknown    Coronary artery disease Unknown    Hypertension Unknown    Colon cancer Neg Hx    Esophageal cancer Neg Hx    Rectal cancer Neg Hx    Stomach cancer Neg Hx    Past Surgical History:  Procedure Laterality Date   COLONOSCOPY  12-03-14   per Dr. Marina Goodell, diverticulosis only, repeat in 10 yrs   LEFT HEART CATHETERIZATION WITH CORONARY ANGIOGRAM N/A 10/22/2014   Procedure: LEFT HEART CATHETERIZATION WITH CORONARY ANGIOGRAM;  Surgeon: Runell Gess, MD;  Location[facility] CATH LAB;  Service: Cardiovascular;  Laterality: N/A;   Social History   Occupational History   Occupation: truck Hospital doctor  Tobacco Use    Smoking status: Current Every Day Smoker    Packs/day: 1.00    Years: 35.00    Pack years: 35.00    Types: Cigarettes   Smokeless tobacco: Never Used   Tobacco comment: 1/2 pack per day  Substance and Sexual Activity   Alcohol use: Yes    Alcohol/week: 3.0 standard drinks    Types: 3 Cans of beer per week    Comment: occ a beer   Drug use:  No   Sexual activity: Not on file

## 2019-04-14 ENCOUNTER — Ambulatory Visit: Payer: BLUE CROSS/BLUE SHIELD | Admitting: Family Medicine

## 2019-04-18 ENCOUNTER — Telehealth: Payer: Self-pay | Admitting: Family Medicine

## 2019-04-18 NOTE — Telephone Encounter (Signed)
Pt called asking if dr hilts had a chance to sign his matrix papers

## 2019-04-18 NOTE — Telephone Encounter (Signed)
Forms re faxed to [facility] that was initially faxed 5/21 [phone] & [phone]

## 2019-04-19 ENCOUNTER — Telehealth: Payer: Self-pay

## 2019-04-19 NOTE — Telephone Encounter (Signed)
Re faxed to [phone]

## 2019-04-19 NOTE — Telephone Encounter (Signed)
I called and advised the patient the papers to [facility] were filled out and faxed last week, but that [employer] called yesterday afternoon to have them refaxed. Tammy in medical records refaxed the form yesterday. The patient did say he received his copy of the form in the mail yesterday.

## 2019-04-19 NOTE — Telephone Encounter (Signed)
[name] with [employer] called stating that they have not received any paperwork and would like for the paperwork to be faxed to this# [phone].  Cb# [phone].  Please advise.  Thank you.

## 2019-04-19 NOTE — Telephone Encounter (Signed)
Do you mind faxing this one more time?

## 2019-04-28 ENCOUNTER — Other Ambulatory Visit: Payer: Self-pay

## 2019-04-28 ENCOUNTER — Encounter: Payer: Self-pay | Admitting: Family Medicine

## 2019-04-28 ENCOUNTER — Ambulatory Visit (INDEPENDENT_AMBULATORY_CARE_PROVIDER_SITE_OTHER): Payer: BC Managed Care – PPO | Admitting: Family Medicine

## 2019-04-28 DIAGNOSIS — I10 Essential (primary) hypertension: Secondary | ICD-10-CM | POA: Diagnosis not present

## 2019-04-28 NOTE — Progress Notes (Signed)
   Subjective:    Patient ID: [name], male    DOB: Feb 01, 1956, 63 y.o.   MRN: [id number]  HPI Here with BP questions. Several times in the past week he has felt weak and lightheaded in the mornings about an hour after he takes his medications, and one morning the BP dropped to 77-44. It came back up later and now he averages in the 120s or 130s over 70s. He had been taking all r of his BP meds in the mornings and then just 2 in the evenings. Otherwise he is doing well. He had been on short term disability due to neck pain, and this was determined to be due to disc disease at the C5-6 level. After treatment with prednisone and rest, the pain went away. He went back to work this past week.  Virtual Visit via Telephone Note  I connected with the patient on 04/28/19 at  8:00 AM EDT by telephone and verified that I am speaking with the correct person using two identifiers. We attempted to connect virtually but we had technical difficulties with the audio and video.    I discussed the limitations, risks, security and privacy concerns of performing an evaluation and management service by telephone and the availability of in person appointments. I also discussed with the patient that there may be a patient responsible charge related to this service. The patient expressed understanding and agreed to proceed.  Location patient: home Location provider: work or home office Participants present for the call: patient, provider Patient did not have a visit in the prior 7 days to address this/these issue(s).   History of Present Illness:    Observations/Objective: Patient sounds cheerful and well on the phone. I do not appreciate any SOB. Speech and thought processing are grossly intact. Patient reported vitals:  Assessment and Plan: He is having BP drops in the mornings due to too much medication at the same time. I advised him to start taking the Amlodipine in the evenings instead of  mornings, and he will monitor the BP. Recheck as needed.  Gershon Crane, MD   Follow Up Instructions:     [phone] 11-20 [phone] 21-30 I did not refer this patient for an OV in the next 24 hours for this/these issue(s).  I discussed the assessment and treatment plan with the patient. The patient was provided an opportunity to ask questions and all were answered. The patient agreed with the plan and demonstrated an understanding of the instructions.   The patient was advised to call back or seek an in-person evaluation if the symptoms worsen or if the condition fails to improve as anticipated.  I provided 13 minutes of non-face-to-face time during this encounter.   Gershon Crane, MD   Review of Systems     Objective:   Physical Exam        Assessment & Plan:

## 2019-09-16 ENCOUNTER — Other Ambulatory Visit: Payer: Self-pay | Admitting: Family Medicine

## 2019-09-29 ENCOUNTER — Other Ambulatory Visit: Payer: Self-pay | Admitting: Family Medicine

## 2019-10-16 ENCOUNTER — Other Ambulatory Visit: Payer: Self-pay

## 2019-10-17 ENCOUNTER — Ambulatory Visit (INDEPENDENT_AMBULATORY_CARE_PROVIDER_SITE_OTHER): Payer: BC Managed Care – PPO | Admitting: Family Medicine

## 2019-10-17 ENCOUNTER — Encounter: Payer: Self-pay | Admitting: Family Medicine

## 2019-10-17 VITALS — BP 150/92 | HR 103 | Temp 97.9°F | Ht 66.5 in | Wt 154.2 lb

## 2019-10-17 DIAGNOSIS — Z23 Encounter for immunization: Secondary | ICD-10-CM

## 2019-10-17 DIAGNOSIS — Z0000 Encounter for general adult medical examination without abnormal findings: Secondary | ICD-10-CM

## 2019-10-17 LAB — BASIC METABOLIC PANEL
BUN: 8 mg/dL (ref 6–23)
CO2: 30 mEq/L (ref 19–32)
Calcium: 9.1 mg/dL (ref 8.4–10.5)
Chloride: 92 mEq/L — ABNORMAL LOW (ref 96–112)
Creatinine, Ser: 0.75 mg/dL (ref 0.40–1.50)
GFR: 105.45 mL/min (ref >60.00)
Glucose, Bld: 95 mg/dL (ref 70–99)
Potassium: 4.7 mEq/L (ref 3.5–5.1)
Sodium: 126 mEq/L — ABNORMAL LOW (ref 135–145)

## 2019-10-17 LAB — HEPATIC FUNCTION PANEL
ALT: 19 U/L (ref 0–53)
AST: 21 U/L (ref 0–37)
Albumin: 4.1 g/dL (ref 3.5–5.2)
Alkaline Phosphatase: 86 U/L (ref 39–117)
Bilirubin, Direct: 0.2 mg/dL (ref 0.0–0.3)
Total Bilirubin: 1.1 mg/dL (ref 0.2–1.2)
Total Protein: 6.3 g/dL (ref 6.0–8.3)

## 2019-10-17 LAB — LIPID PANEL
Cholesterol: 138 mg/dL (ref 0–200)
HDL: 48.1 mg/dL (ref >39.00)
LDL Cholesterol: 82 mg/dL (ref 0–99)
NonHDL: 89.8
Total CHOL/HDL Ratio: 3
Triglycerides: 41 mg/dL (ref 0.0–149.0)
VLDL: 8.2 mg/dL (ref 0.0–40.0)

## 2019-10-17 LAB — CBC WITH DIFFERENTIAL/PLATELET
Basophils Absolute: 0 10*3/uL (ref 0.0–0.1)
Basophils Relative: 1 % (ref 0.0–3.0)
Eosinophils Absolute: 0.1 10*3/uL (ref 0.0–0.7)
Eosinophils Relative: 2.2 % (ref 0.0–5.0)
HCT: 44.3 % (ref 39.0–52.0)
Hemoglobin: 14.9 g/dL (ref 13.0–17.0)
Lymphocytes Relative: 24.9 % (ref 12.0–46.0)
Lymphs Abs: 1.1 10*3/uL (ref 0.7–4.0)
MCHC: 33.6 g/dL (ref 30.0–36.0)
MCV: 99.2 fl (ref 78.0–100.0)
Monocytes Absolute: 0.5 10*3/uL (ref 0.1–1.0)
Monocytes Relative: 10.8 % (ref 3.0–12.0)
Neutro Abs: 2.8 10*3/uL (ref 1.4–7.7)
Neutrophils Relative %: 61.1 % (ref 43.0–77.0)
Platelets: 258 10*3/uL (ref 150.0–400.0)
RBC: 4.47 Mil/uL (ref 4.22–5.81)
RDW: 13.6 % (ref 11.5–15.5)
WBC: 4.6 10*3/uL (ref 4.0–10.5)

## 2019-10-17 LAB — TSH: TSH: 1.02 u[IU]/mL (ref 0.35–4.50)

## 2019-10-17 LAB — PSA: PSA: 0.55 ng/mL (ref 0.10–4.00)

## 2019-10-17 NOTE — Progress Notes (Signed)
   Subjective:    Patient ID: [name], male    DOB: August 01, 1956, 63 y.o.   MRN: [id number]  HPI Here for a well exam. He feels well. His BP at home is stable in the 110s or 120s over 70s or 80s.    Review of Systems  Constitutional: Negative.   HENT: Negative.   Eyes: Negative.   Respiratory: Negative.   Cardiovascular: Negative.   Gastrointestinal: Negative.   Genitourinary: Negative.   Musculoskeletal: Negative.   Skin: Negative.   Neurological: Negative.   Psychiatric/Behavioral: Negative.        Objective:   Physical Exam Constitutional:      General: He is not in acute distress.    Appearance: He is well-developed. He is not diaphoretic.  HENT:     Head: Normocephalic and atraumatic.     Right Ear: External ear normal.     Left Ear: External ear normal.     Nose: Nose normal.     Mouth/Throat:     Pharynx: No oropharyngeal exudate.  Eyes:     General: No scleral icterus.       Right eye: No discharge.        Left eye: No discharge.     Conjunctiva/sclera: Conjunctivae normal.     Pupils: Pupils are equal, round, and reactive to light.  Neck:     Musculoskeletal: Neck supple.     Thyroid: No thyromegaly.     Vascular: No JVD.     Trachea: No tracheal deviation.  Cardiovascular:     Rate and Rhythm: Normal rate and regular rhythm.     Heart sounds: Normal heart sounds. No murmur. No friction rub. No gallop.   Pulmonary:     Effort: Pulmonary effort is normal. No respiratory distress.     Breath sounds: Normal breath sounds. No wheezing or rales.  Chest:     Chest wall: No tenderness.  Abdominal:     General: Bowel sounds are normal. There is no distension.     Palpations: Abdomen is soft. There is no mass.     Tenderness: There is no abdominal tenderness. There is no guarding or rebound.  Genitourinary:    Penis: Normal. No tenderness.      Scrotum/Testes: Normal.     Prostate: Normal.     Rectum: Normal. Guaiac result negative.   Musculoskeletal: Normal range of motion.        General: No tenderness.  Lymphadenopathy:     Cervical: No cervical adenopathy.  Skin:    General: Skin is warm and dry.     Coloration: Skin is not pale.     Findings: No erythema or rash.  Neurological:     Mental Status: He is alert and oriented to person, place, and time.     Cranial Nerves: No cranial nerve deficit.     Motor: No abnormal muscle tone.     Coordination: Coordination normal.     Deep Tendon Reflexes: Reflexes are normal and symmetric. Reflexes normal.  Psychiatric:        Behavior: Behavior normal.        Thought Content: Thought content normal.        Judgment: Judgment normal.           Assessment & Plan:  Well exam. We discussed diet and exercise. Get fasting labs. Alysia Penna, MD

## 2019-10-17 NOTE — Patient Instructions (Signed)
Health Maintenance Due  Topic Date Due  . Hepatitis C Screening  April 28, 1956  . HIV Screening  07/22/1972  . TETANUS/TDAP  07/22/1976  . INFLUENZA VACCINE  06/24/2019    Depression screen [facility] 2/9 10/14/2018 10/08/2017 10/08/2017  Decreased Interest 0 0 0  Down, Depressed, Hopeless 0 0 0  PHQ - 2 Score 0 0 0

## 2019-10-26 NOTE — Addendum Note (Signed)
Addended by: Gwenyth Ober R on: 10/26/2019 11:18 PM   Modules accepted: Orders

## 2019-12-28 ENCOUNTER — Other Ambulatory Visit: Payer: Self-pay | Admitting: Family Medicine

## 2020-01-27 IMAGING — MR MRI CERVICAL SPINE WITHOUT CONTRAST
4 of 5 series · 27 of 48 positions shown · non-contrast
Comparison: Cervical spine radiographs 01/24/2019

CLINICAL DATA: Neck pain. Severe right shoulder pain for 1 month.
Right upper extremity numbness extending into the thumb.

EXAM:
MRI CERVICAL SPINE WITHOUT CONTRAST
TECHNIQUE: Multiplanar, multisequence MR imaging of the cervical spine was
performed. No intravenous contrast was administered.

[Series 5: T1 · sagittal · 3.0mm · 0.66mm/px · 6 of 13 slices shown]
[im 1/13]
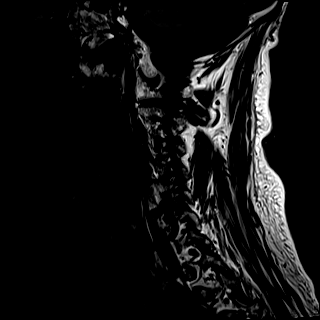
[im 3/13]
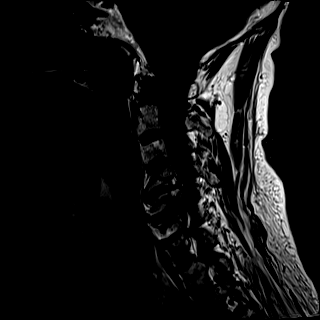
[im 5/13]
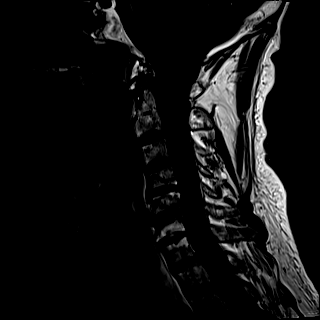
[im 8/13]
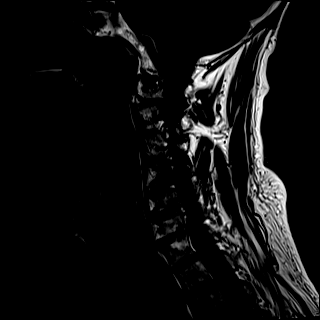
[im 10/13]
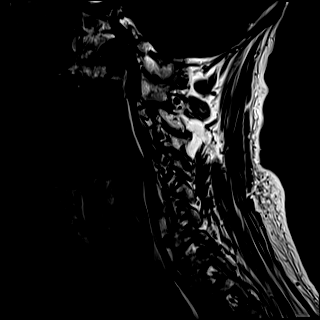
[im 13/13]
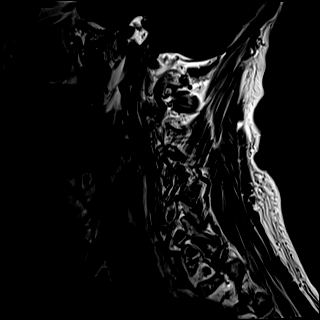

[Series 6: T2 · sagittal · 3.0mm · 0.55mm/px · 6 of 13 slices shown (1 of 2)]
[im 1/13]
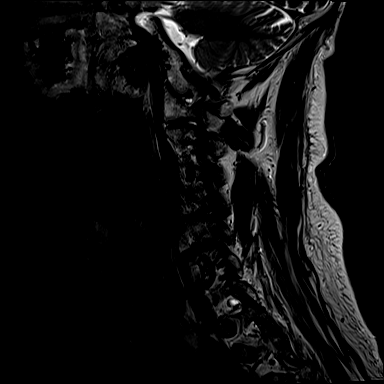
[im 3/13]
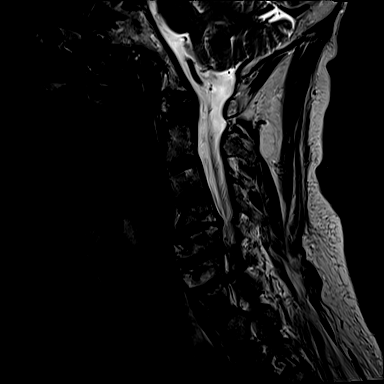
[im 5/13]
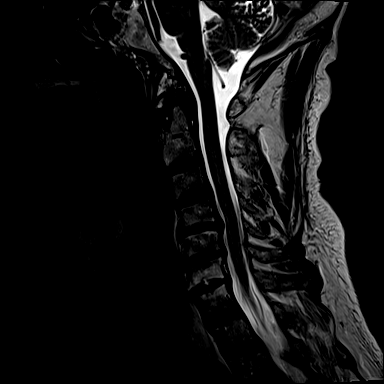
[im 8/13]
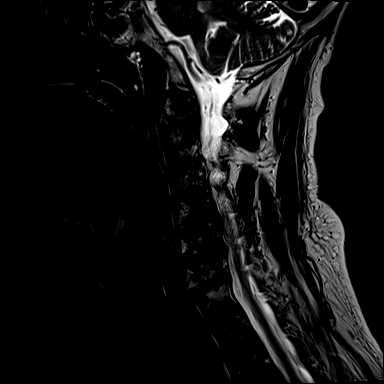
[im 10/13]
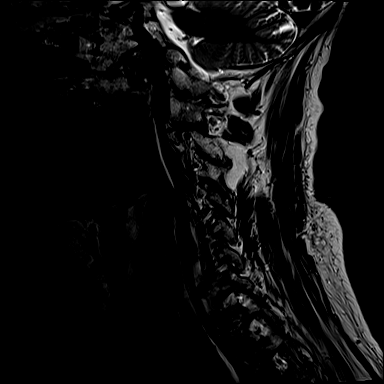
[im 13/13]
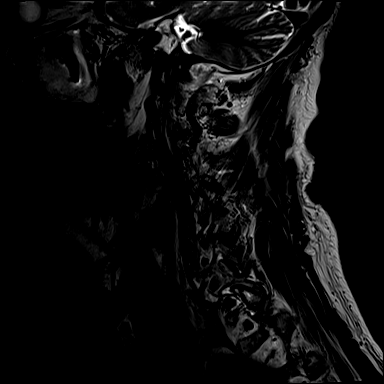

[Series 7: STIR · sagittal · 3.0mm · 0.33mm/px · 6 of 13 slices shown]
[im 1/13]
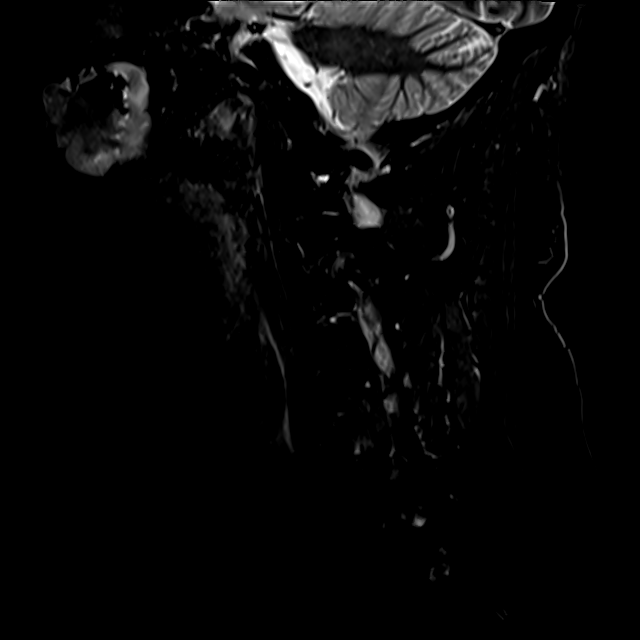
[im 3/13]
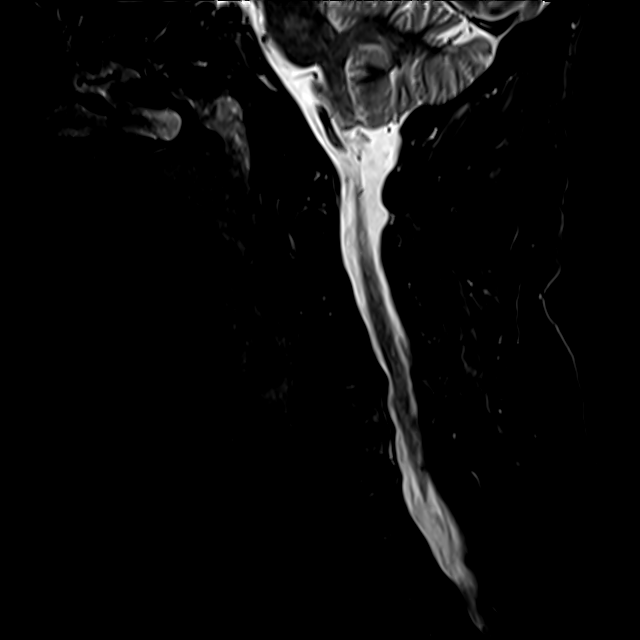
[im 5/13]
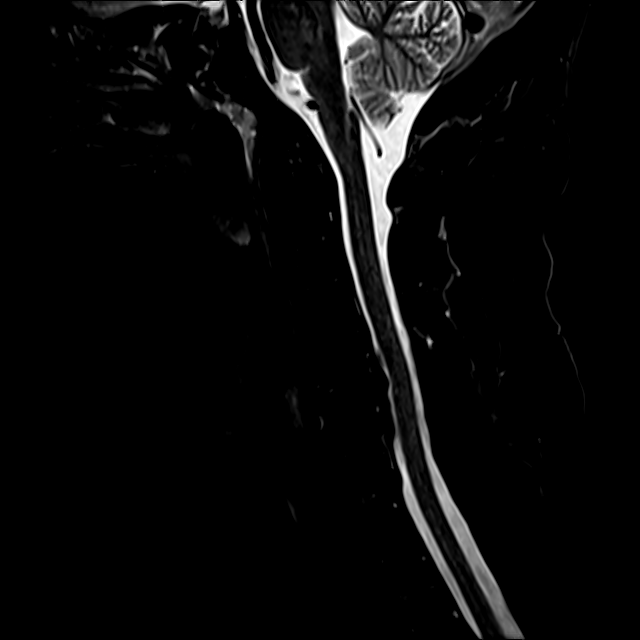
[im 8/13]
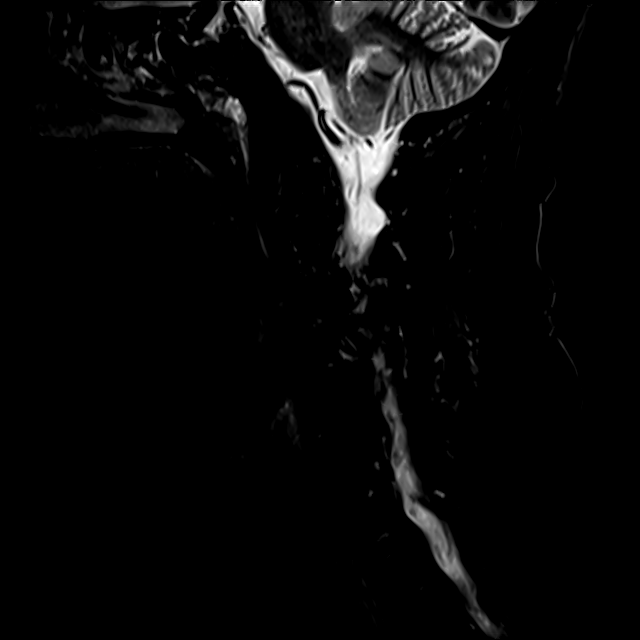
[im 10/13]
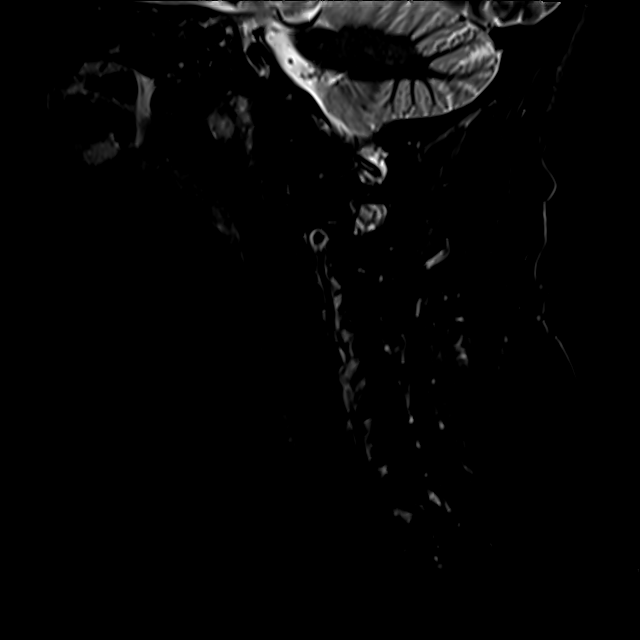
[im 13/13]
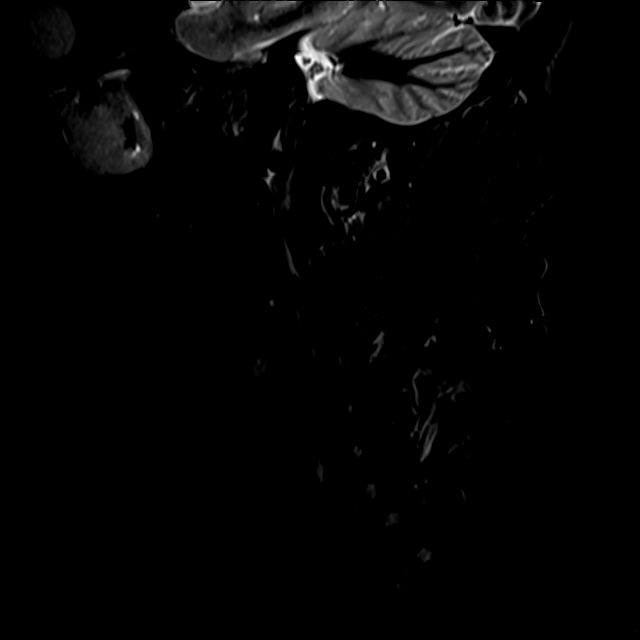

[Series 8: T2 · axial · 3.0mm · 0.50mm/px · z∈[-46,+56]mm · 9 of 34 slices shown (2 of 2)]
[im 1/34]
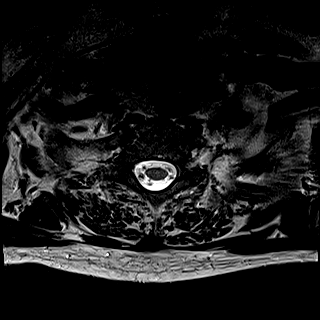
[im 5/34]
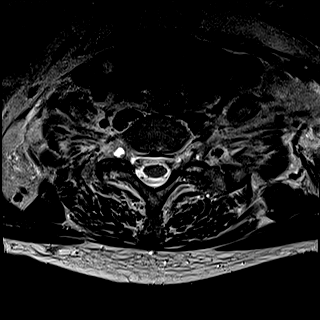
[im 10/34]
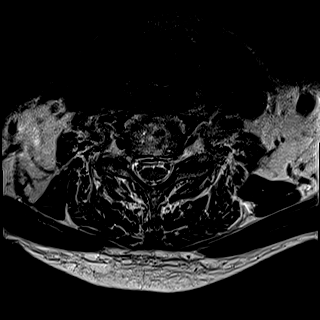
[im 15/34]
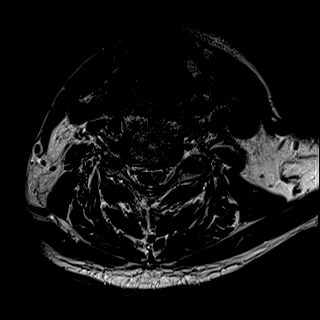
[im 17/34]
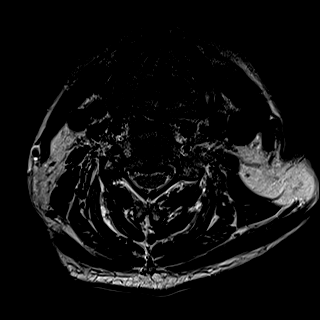
[im 19/34]
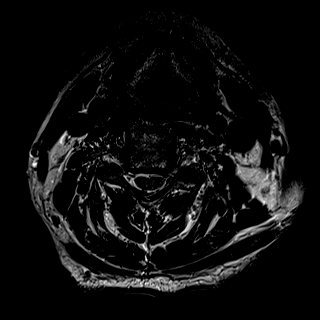
[im 24/34]
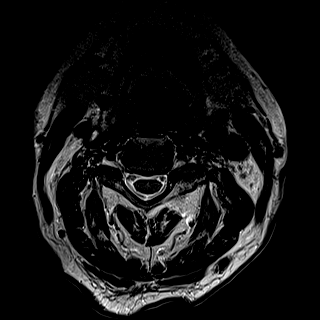
[im 29/34]
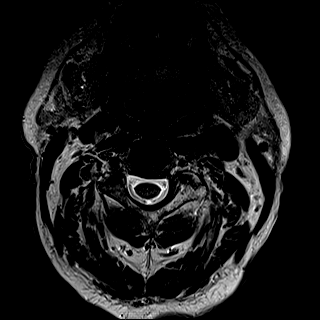
[im 34/34]
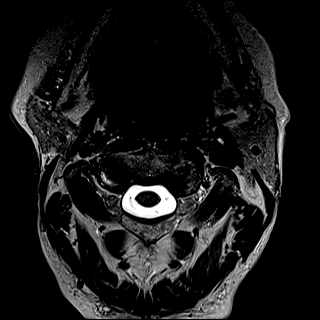

[27 of 48 positions shown; findings below may reference images not displayed]

FINDINGS: Alignment: Straightening/slight reversal of the normal cervical
lordosis. No listhesis.

Vertebrae: No fracture or suspicious osseous lesion. Chronic
degenerative endplate changes in the mid and lower cervical spine,
greatest at C6-7. Mild left facet edema at C7-T1.

Cord: Normal signal.

Posterior Fossa, vertebral arteries, paraspinal tissues:
Subcentimeter T2 hyperintense nodules in the thyroid. Preserved
vertebral artery flow voids.

Disc levels:

Moderate disc space narrowing at C4-5 and C6-7 with milder narrowing
at C5-6.

C2-3: Mild left uncovertebral spurring and moderate left facet
arthrosis result in mild left neural foraminal stenosis without
spinal stenosis.

C3-4: Shallow central disc protrusion without spinal stenosis.
Mild-to-moderate left neural foraminal stenosis due to moderate left
facet arthrosis.

C4-5: Disc bulging and uncovertebral spurring result in mild spinal
stenosis with slight cord flattening and moderate right and mild
left neural foraminal stenosis with potential right C5 nerve root
impingement.

C5-6: Disc bulging and uncovertebral spurring result in moderate to
severe right and moderate left neural foraminal stenosis with
potential right C6 nerve root impingement. No spinal stenosis.

C6-7: Disc bulging and uncovertebral spurring result in mild
bilateral neural foraminal stenosis without spinal stenosis.

C7-T1: Mild facet arthrosis without disc herniation or stenosis.
IMPRESSION: 1. Moderately advanced cervical disc degeneration.
2. Mild spinal stenosis and moderate right neural foraminal stenosis
at C4-5.
3. Moderate to severe right and moderate left neural foraminal
stenosis at C5-6.

## 2020-03-11 DIAGNOSIS — H6122 Impacted cerumen, left ear: Secondary | ICD-10-CM | POA: Diagnosis not present

## 2020-03-11 DIAGNOSIS — H60331 Swimmer's ear, right ear: Secondary | ICD-10-CM | POA: Diagnosis not present

## 2020-03-11 DIAGNOSIS — H903 Sensorineural hearing loss, bilateral: Secondary | ICD-10-CM | POA: Diagnosis not present

## 2020-03-14 DIAGNOSIS — H6121 Impacted cerumen, right ear: Secondary | ICD-10-CM | POA: Diagnosis not present

## 2020-03-14 DIAGNOSIS — H61301 Acquired stenosis of right external ear canal, unspecified: Secondary | ICD-10-CM | POA: Diagnosis not present

## 2020-03-14 DIAGNOSIS — H60331 Swimmer's ear, right ear: Secondary | ICD-10-CM | POA: Diagnosis not present

## 2020-03-27 ENCOUNTER — Other Ambulatory Visit: Payer: Self-pay | Admitting: Family Medicine

## 2020-04-11 ENCOUNTER — Other Ambulatory Visit: Payer: Self-pay

## 2020-04-12 ENCOUNTER — Encounter: Payer: Self-pay | Admitting: Family Medicine

## 2020-04-12 ENCOUNTER — Ambulatory Visit (INDEPENDENT_AMBULATORY_CARE_PROVIDER_SITE_OTHER): Payer: BC Managed Care – PPO | Admitting: Family Medicine

## 2020-04-12 VITALS — BP 130/72 | HR 69 | Temp 98.2°F | Wt 151.2 lb

## 2020-04-12 DIAGNOSIS — I739 Peripheral vascular disease, unspecified: Secondary | ICD-10-CM

## 2020-04-12 DIAGNOSIS — I10 Essential (primary) hypertension: Secondary | ICD-10-CM

## 2020-04-12 DIAGNOSIS — R29898 Other symptoms and signs involving the musculoskeletal system: Secondary | ICD-10-CM | POA: Diagnosis not present

## 2020-04-12 LAB — BASIC METABOLIC PANEL
BUN: 10 mg/dL (ref 6–23)
CO2: 30 mEq/L (ref 19–32)
Calcium: 9.3 mg/dL (ref 8.4–10.5)
Chloride: 94 mEq/L — ABNORMAL LOW (ref 96–112)
Creatinine, Ser: 0.76 mg/dL (ref 0.40–1.50)
GFR: 103.68 mL/min (ref >60.00)
Glucose, Bld: 71 mg/dL (ref 70–99)
Potassium: 4.5 mEq/L (ref 3.5–5.1)
Sodium: 126 mEq/L — ABNORMAL LOW (ref 135–145)

## 2020-04-12 LAB — CBC WITH DIFFERENTIAL/PLATELET
Basophils Absolute: 0 10*3/uL (ref 0.0–0.1)
Basophils Relative: 0.8 % (ref 0.0–3.0)
Eosinophils Absolute: 0.1 10*3/uL (ref 0.0–0.7)
Eosinophils Relative: 2.4 % (ref 0.0–5.0)
HCT: 40.3 % (ref 39.0–52.0)
Hemoglobin: 13.7 g/dL (ref 13.0–17.0)
Lymphocytes Relative: 24.1 % (ref 12.0–46.0)
Lymphs Abs: 1.3 10*3/uL (ref 0.7–4.0)
MCHC: 34 g/dL (ref 30.0–36.0)
MCV: 97.5 fl (ref 78.0–100.0)
Monocytes Absolute: 0.6 10*3/uL (ref 0.1–1.0)
Monocytes Relative: 11.4 % (ref 3.0–12.0)
Neutro Abs: 3.3 10*3/uL (ref 1.4–7.7)
Neutrophils Relative %: 61.3 % (ref 43.0–77.0)
Platelets: 293 10*3/uL (ref 150.0–400.0)
RBC: 4.14 Mil/uL — ABNORMAL LOW (ref 4.22–5.81)
RDW: 13.7 % (ref 11.5–15.5)
WBC: 5.4 10*3/uL (ref 4.0–10.5)

## 2020-04-12 LAB — VITAMIN B12: Vitamin B-12: 116 pg/mL — ABNORMAL LOW (ref 211–911)

## 2020-04-12 LAB — TSH: TSH: 1.03 u[IU]/mL (ref 0.35–4.50)

## 2020-04-12 NOTE — Progress Notes (Signed)
   Subjective:    Patient ID: [name], male    DOB: Oct 19, 1956, 64 y.o.   MRN: [id number]  HPI Here to follow up. His only concern is weakness in both legs for the past few months. There is no pain or numbness or tingling. He denies any generalized weakness or chest pain or SOB. His BP has been stable at home. He had a normal LE arterial duplex in 2015.    Review of Systems  Constitutional: Negative.   Respiratory: Negative.   Cardiovascular: Negative.   Gastrointestinal: Negative.   Genitourinary: Negative.   Neurological: Positive for weakness. Negative for numbness.       Objective:   Physical Exam Constitutional:      Appearance: Normal appearance. He is not ill-appearing.  Cardiovascular:     Rate and Rhythm: Normal rate and regular rhythm.     Pulses: Normal pulses.     Heart sounds: Normal heart sounds.     Comments: DP and PT pulses are absent bilaterally  Pulmonary:     Effort: Pulmonary effort is normal.     Breath sounds: Normal breath sounds.  Musculoskeletal:     Right lower leg: No edema.     Left lower leg: No edema.  Skin:    General: Skin is warm.  Neurological:     Mental Status: He is alert.           Assessment & Plan:  Leg weakness, get labs today. We will also set up another LE arterial duplex.  Gershon Crane, MD

## 2020-04-23 ENCOUNTER — Other Ambulatory Visit: Payer: Self-pay

## 2020-04-23 MED ORDER — CYANOCOBALAMIN 1000 MCG/ML IJ SOLN
1000.0000 ug | Freq: Once | INTRAMUSCULAR | 0 refills | Status: AC
Start: 2020-04-23 — End: 2020-04-23

## 2020-04-23 MED ORDER — "BD ECLIPSE SHIELDED NEEDLE 22G X 1"" MISC": 0 refills | Status: DC

## 2020-04-25 ENCOUNTER — Telehealth: Payer: Self-pay | Admitting: Family Medicine

## 2020-04-25 NOTE — Telephone Encounter (Signed)
Error

## 2020-05-01 ENCOUNTER — Inpatient Hospital Stay (HOSPITAL_COMMUNITY): Admission: RE | Admit: 2020-05-01 | Payer: BC Managed Care – PPO | Source: Ambulatory Visit

## 2020-05-01 ENCOUNTER — Other Ambulatory Visit: Payer: Self-pay | Admitting: Family Medicine

## 2020-05-01 DIAGNOSIS — R0989 Other specified symptoms and signs involving the circulatory and respiratory systems: Secondary | ICD-10-CM

## 2020-05-01 DIAGNOSIS — R29898 Other symptoms and signs involving the musculoskeletal system: Secondary | ICD-10-CM

## 2020-05-09 ENCOUNTER — Other Ambulatory Visit: Payer: Self-pay | Admitting: Family Medicine

## 2020-05-09 DIAGNOSIS — R29898 Other symptoms and signs involving the musculoskeletal system: Secondary | ICD-10-CM

## 2020-05-09 DIAGNOSIS — R0989 Other specified symptoms and signs involving the circulatory and respiratory systems: Secondary | ICD-10-CM

## 2020-05-10 ENCOUNTER — Inpatient Hospital Stay (HOSPITAL_COMMUNITY): Admission: RE | Admit: 2020-05-10 | Payer: BC Managed Care – PPO | Source: Ambulatory Visit

## 2020-05-17 ENCOUNTER — Encounter (HOSPITAL_COMMUNITY): Payer: BC Managed Care – PPO

## 2020-05-21 ENCOUNTER — Ambulatory Visit (HOSPITAL_COMMUNITY)
Admission: RE | Admit: 2020-05-21 | Discharge: 2020-05-21 | Disposition: A | Discharge status: Home / Self Care | Payer: BC Managed Care – PPO | Source: Ambulatory Visit | Attending: Cardiovascular Disease | Admitting: Cardiovascular Disease

## 2020-05-21 ENCOUNTER — Encounter: Payer: Self-pay | Admitting: *Deleted

## 2020-05-21 ENCOUNTER — Other Ambulatory Visit: Payer: Self-pay

## 2020-05-21 DIAGNOSIS — R29898 Other symptoms and signs involving the musculoskeletal system: Secondary | ICD-10-CM | POA: Diagnosis not present

## 2020-05-21 DIAGNOSIS — R0989 Other specified symptoms and signs involving the circulatory and respiratory systems: Secondary | ICD-10-CM | POA: Diagnosis not present

## 2020-07-09 ENCOUNTER — Other Ambulatory Visit: Payer: Self-pay | Admitting: Family Medicine

## 2020-07-31 DIAGNOSIS — H903 Sensorineural hearing loss, bilateral: Secondary | ICD-10-CM | POA: Diagnosis not present

## 2020-07-31 DIAGNOSIS — H6123 Impacted cerumen, bilateral: Secondary | ICD-10-CM | POA: Diagnosis not present

## 2020-07-31 DIAGNOSIS — H6063 Unspecified chronic otitis externa, bilateral: Secondary | ICD-10-CM | POA: Diagnosis not present

## 2020-08-15 ENCOUNTER — Telehealth: Payer: Self-pay | Admitting: Family Medicine

## 2020-08-15 NOTE — Telephone Encounter (Signed)
Pt is asking for a call back. Will not give any details about it.  Please advise

## 2020-08-19 NOTE — Telephone Encounter (Signed)
Patient called to inquire about his message below. He says a few years ago he had jock itch and was prescribed a medication for that and also an antibiotic for boils. He says he would like both ordered, if possible. I asked is he able to come in for a visit. 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 I advised I will send this to Dr. Clent Ridges and call back with his recommendation, patient verbalized understanding.

## 2020-08-21 ENCOUNTER — Other Ambulatory Visit: Payer: Self-pay

## 2020-08-21 MED ORDER — CEPHALEXIN 500 MG PO CAPS
500.0000 mg | ORAL_CAPSULE | Freq: Three times a day (TID) | ORAL | 0 refills | Status: DC
Start: 2020-08-21 — End: 2021-04-15

## 2020-08-21 MED ORDER — TRIAMCINOLONE ACETONIDE 0.1 % EX CREA
TOPICAL_CREAM | Freq: Two times a day (BID) | CUTANEOUS | 3 refills | Status: DC | PRN
Start: 2020-08-21 — End: 2022-06-02

## 2020-08-21 NOTE — Telephone Encounter (Signed)
Patient called and advised of Dr. Claris Che message below. He says he broke out in a rash the last time he took doxycycline. Dr. Clent Ridges says to switch to Cephalexin 500 ng TID x 14 days. Patient advised of the change. He verbalized understanding and says to send to [facility] in [place].

## 2020-08-21 NOTE — Addendum Note (Signed)
Addended by: Wilford Corner on: 08/21/2020 01:47 PM   Modules accepted: Orders

## 2020-08-21 NOTE — Telephone Encounter (Signed)
For the jock itch, call in Triamcinolone 0.1% cream to apply BID as needed, 45 grams with 3 rf. For the boils, call in Doxycycline 100 mg BID for 14 days

## 2020-10-10 ENCOUNTER — Other Ambulatory Visit: Payer: Self-pay | Admitting: Family Medicine

## 2020-10-10 NOTE — Telephone Encounter (Signed)
amLODipine (NORVASC) 10 MG tablet  cloNIDine (CATAPRES) 0.2 MG tablet  losartan (COZAAR) 50 MG tablet  metoprolol (TOPROL-XL) 200 MG 24 hr tablet  [id number] - [place], [place] - [address] [place] [address] Phone:  [phone]  Fax:  [phone]

## 2020-10-25 ENCOUNTER — Encounter: Payer: BC Managed Care – PPO | Admitting: Family Medicine

## 2020-11-01 ENCOUNTER — Ambulatory Visit (INDEPENDENT_AMBULATORY_CARE_PROVIDER_SITE_OTHER): Payer: BC Managed Care – PPO | Admitting: Family Medicine

## 2020-11-01 ENCOUNTER — Other Ambulatory Visit: Payer: Self-pay

## 2020-11-01 ENCOUNTER — Encounter: Payer: Self-pay | Admitting: Family Medicine

## 2020-11-01 VITALS — BP 144/80 | HR 45 | Temp 97.9°F | Ht 67.0 in | Wt 152.0 lb

## 2020-11-01 DIAGNOSIS — Z0000 Encounter for general adult medical examination without abnormal findings: Secondary | ICD-10-CM | POA: Diagnosis not present

## 2020-11-01 DIAGNOSIS — E538 Deficiency of other specified B group vitamins: Secondary | ICD-10-CM | POA: Diagnosis not present

## 2020-11-01 DIAGNOSIS — Z23 Encounter for immunization: Secondary | ICD-10-CM

## 2020-11-01 MED ORDER — CLONIDINE HCL 0.2 MG PO TABS: 0.2000 mg | ORAL_TABLET | Freq: Two times a day (BID) | ORAL | 3 refills | Status: DC

## 2020-11-01 MED ORDER — METOPROLOL SUCCINATE ER 200 MG PO TB24: 200.0000 mg | ORAL_TABLET | Freq: Every day | ORAL | 3 refills | Status: DC

## 2020-11-01 MED ORDER — FLUTICASONE PROPIONATE 50 MCG/ACT NA SUSP
2.0000 | Freq: Every day | NASAL | 3 refills | Status: DC
Start: 2020-11-01 — End: 2021-12-05

## 2020-11-01 MED ORDER — LOSARTAN POTASSIUM 50 MG PO TABS: 50.0000 mg | ORAL_TABLET | Freq: Two times a day (BID) | ORAL | 3 refills | Status: DC

## 2020-11-01 MED ORDER — AMLODIPINE BESYLATE 10 MG PO TABS: 10.0000 mg | ORAL_TABLET | Freq: Every day | ORAL | 3 refills | Status: DC

## 2020-11-01 NOTE — Progress Notes (Signed)
Subjective:    Patient ID: [name], male    DOB: 1956-07-27, 64 y.o.   MRN: [id number]  HPI Here for a well exam. He is doing well.    Review of Systems  Constitutional: Negative.   HENT: Negative.   Eyes: Negative.   Respiratory: Negative.   Cardiovascular: Negative.   Gastrointestinal: Negative.   Genitourinary: Negative.   Musculoskeletal: Negative.   Skin: Negative.   Neurological: Negative.   Psychiatric/Behavioral: Negative.        Objective:   Physical Exam Constitutional:      General: He is not in acute distress.    Appearance: Normal appearance. He is well-developed and well-nourished. He is not diaphoretic.  HENT:     Head: Normocephalic and atraumatic.     Right Ear: External ear normal.     Left Ear: External ear normal.     Nose: Nose normal.     Mouth/Throat:     Mouth: Oropharynx is clear and moist.     Pharynx: No oropharyngeal exudate.  Eyes:     General: No scleral icterus.       Right eye: No discharge.        Left eye: No discharge.     Extraocular Movements: EOM normal.     Conjunctiva/sclera: Conjunctivae normal.     Pupils: Pupils are equal, round, and reactive to light.  Neck:     Thyroid: No thyromegaly.     Vascular: No JVD.     Trachea: No tracheal deviation.  Cardiovascular:     Rate and Rhythm: Normal rate and regular rhythm.     Pulses: Intact distal pulses.     Heart sounds: Normal heart sounds. No murmur heard. No friction rub. No gallop.   Pulmonary:     Effort: Pulmonary effort is normal. No respiratory distress.     Breath sounds: Normal breath sounds. No wheezing or rales.  Chest:     Chest wall: No tenderness.  Abdominal:     General: Bowel sounds are normal. There is no distension.     Palpations: Abdomen is soft. There is no mass.     Tenderness: There is no abdominal tenderness. There is no guarding or rebound.  Genitourinary:    Penis: Normal. No tenderness.      Testes: Normal.     Prostate:  Normal.     Rectum: Normal. Guaiac result negative.  Musculoskeletal:        General: No tenderness or edema. Normal range of motion.     Cervical back: Neck supple.  Lymphadenopathy:     Cervical: No cervical adenopathy.  Skin:    General: Skin is warm and dry.     Coloration: Skin is not pale.     Findings: No erythema or rash.  Neurological:     Mental Status: He is alert and oriented to person, place, and time.     Cranial Nerves: No cranial nerve deficit.     Motor: No abnormal muscle tone.     Coordination: Coordination normal.     Deep Tendon Reflexes: Reflexes are normal and symmetric. Reflexes normal.  Psychiatric:        Mood and Affect: Mood and affect normal.        Behavior: Behavior normal.        Thought Content: Thought content normal.        Judgment: Judgment normal.           Assessment & Plan:  Well exam. We discussed diet and exercise. Get fasting labs.  Alysia Penna, MD

## 2020-11-02 LAB — BASIC METABOLIC PANEL WITH GFR
BUN: 9 mg/dL (ref 7–25)
CO2: 28 mmol/L (ref 20–32)
Calcium: 9.6 mg/dL (ref 8.6–10.3)
Chloride: 94 mmol/L — ABNORMAL LOW (ref 98–110)
Creat: 0.74 mg/dL (ref 0.70–1.25)
GFR, Est African American: 114 mL/min/{1.73_m2} (ref >=60)
GFR, Est Non African American: 98 mL/min/{1.73_m2} (ref >=60)
Glucose, Bld: 99 mg/dL (ref 65–99)
Potassium: 5 mmol/L (ref 3.5–5.3)
Sodium: 129 mmol/L — ABNORMAL LOW (ref 135–146)

## 2020-11-02 LAB — CBC WITH DIFFERENTIAL/PLATELET
Absolute Monocytes: 523 cells/uL (ref 200–950)
Basophils Absolute: 28 cells/uL (ref 0–200)
Basophils Relative: 0.5 %
Eosinophils Absolute: 132 cells/uL (ref 15–500)
Eosinophils Relative: 2.4 %
HCT: 42.7 % (ref 38.5–50.0)
Hemoglobin: 14.8 g/dL (ref 13.2–17.1)
Lymphs Abs: 1441 cells/uL (ref 850–3900)
MCH: 33.3 pg — ABNORMAL HIGH (ref 27.0–33.0)
MCHC: 34.7 g/dL (ref 32.0–36.0)
MCV: 96.2 fL (ref 80.0–100.0)
MPV: 9.5 fL (ref 7.5–12.5)
Monocytes Relative: 9.5 %
Neutro Abs: 3377 cells/uL (ref 1500–7800)
Neutrophils Relative %: 61.4 %
Platelets: 304 10*3/uL (ref 140–400)
RBC: 4.44 10*6/uL (ref 4.20–5.80)
RDW: 12.4 % (ref 11.0–15.0)
Total Lymphocyte: 26.2 %
WBC: 5.5 10*3/uL (ref 3.8–10.8)

## 2020-11-02 LAB — HEPATIC FUNCTION PANEL
AG Ratio: 2.2 (calc) (ref 1.0–2.5)
ALT: 17 U/L (ref 9–46)
AST: 19 U/L (ref 10–35)
Albumin: 4.4 g/dL (ref 3.6–5.1)
Alkaline phosphatase (APISO): 84 U/L (ref 35–144)
Bilirubin, Direct: 0.2 mg/dL (ref 0.0–0.2)
Globulin: 2 g/dL (calc) (ref 1.9–3.7)
Indirect Bilirubin: 0.6 mg/dL (calc) (ref 0.2–1.2)
Total Bilirubin: 0.8 mg/dL (ref 0.2–1.2)
Total Protein: 6.4 g/dL (ref 6.1–8.1)

## 2020-11-02 LAB — LIPID PANEL
Cholesterol: 159 mg/dL (ref <200)
HDL: 49 mg/dL (ref >=40)
LDL Cholesterol (Calc): 95 mg/dL (calc)
Non-HDL Cholesterol (Calc): 110 mg/dL (calc) (ref <130)
Total CHOL/HDL Ratio: 3.2 (calc) (ref <5.0)
Triglycerides: 67 mg/dL (ref <150)

## 2020-11-02 LAB — TSH: TSH: 1.07 mIU/L (ref 0.40–4.50)

## 2020-11-02 LAB — PSA: PSA: 0.54 ng/mL (ref <=4.0)

## 2020-11-02 LAB — VITAMIN B12: Vitamin B-12: 404 pg/mL (ref 200–1100)

## 2021-01-20 ENCOUNTER — Other Ambulatory Visit: Payer: Self-pay | Admitting: Family Medicine

## 2021-01-21 ENCOUNTER — Other Ambulatory Visit: Payer: Self-pay | Admitting: Family Medicine

## 2021-01-22 ENCOUNTER — Other Ambulatory Visit: Payer: Self-pay | Admitting: Family Medicine

## 2021-04-15 ENCOUNTER — Encounter: Payer: Self-pay | Admitting: Family Medicine

## 2021-04-15 ENCOUNTER — Ambulatory Visit: Payer: BC Managed Care – PPO | Admitting: Family Medicine

## 2021-04-15 ENCOUNTER — Other Ambulatory Visit: Payer: Self-pay

## 2021-04-15 VITALS — BP 138/88 | HR 62 | Temp 98.7°F | Wt 150.6 lb

## 2021-04-15 DIAGNOSIS — L509 Urticaria, unspecified: Secondary | ICD-10-CM

## 2021-04-15 MED ORDER — PREDNISONE 10 MG PO TABS
ORAL_TABLET | ORAL | 0 refills | Status: DC
Start: 2021-04-15 — End: 2021-11-21

## 2021-04-15 NOTE — Progress Notes (Signed)
   Subjective:    Patient ID: [name], male    DOB: 1956-08-13, 65 y.o.   MRN: [id number]  HPI Here for hives that started 2 months ago. They come and go and they are very itchy. No lip swelling or SOB. He has tried Benadryl with no relief. He has changes soaps, detergents, etc with no relief. He has been on the same medications for years .   Review of Systems  Constitutional: Negative.   Respiratory: Negative.   Cardiovascular: Negative.   Skin: Positive for rash.       Objective:   Physical Exam Constitutional:      General: He is not in acute distress.    Appearance: Normal appearance.  Cardiovascular:     Rate and Rhythm: Normal rate and regular rhythm.     Pulses: Normal pulses.     Heart sounds: Normal heart sounds.  Pulmonary:     Effort: Pulmonary effort is normal.     Breath sounds: Normal breath sounds.  Skin:    Comments: Scattered pink urticaria on trunk   Neurological:     Mental Status: He is alert.           Assessment & Plan:  Hives, he will take Zyrtec 10 mg BID and Pepcid 20 mg BID. Add a Prednisone taper over 25 days beginning with 50 mg a day. Recheck as needed.  Gershon Crane, MD

## 2021-05-01 ENCOUNTER — Other Ambulatory Visit: Payer: Self-pay

## 2021-05-02 ENCOUNTER — Encounter: Payer: Self-pay | Admitting: Family Medicine

## 2021-05-02 ENCOUNTER — Ambulatory Visit: Payer: BC Managed Care – PPO | Admitting: Family Medicine

## 2021-05-02 VITALS — BP 136/80 | HR 69 | Temp 97.6°F | Wt 149.4 lb

## 2021-05-02 DIAGNOSIS — L509 Urticaria, unspecified: Secondary | ICD-10-CM

## 2021-05-02 NOTE — Progress Notes (Signed)
   Subjective:    Patient ID: [name], male    DOB: 12-08-1955, 65 y.o.   MRN: [id number]  HPI Here to follow up on hives. We saw him for this on 04-15-21, and we started him on a Prednisone taper. He has almost completed the taper. He reports that after taking the first day's dose, the hives went away completely. He feels fine now.    Review of Systems  Constitutional: Negative.   Respiratory: Negative.    Cardiovascular: Negative.   Skin: Negative.       Objective:   Physical Exam Constitutional:      Appearance: Normal appearance.  Cardiovascular:     Rate and Rhythm: Normal rate and regular rhythm.     Pulses: Normal pulses.     Heart sounds: Normal heart sounds.  Pulmonary:     Effort: Pulmonary effort is normal.     Breath sounds: Normal breath sounds.  Skin:    Findings: No rash.  Neurological:     Mental Status: He is alert.          Assessment & Plan:  Hives, resolved. He will finish up the taper and follow up as needed.  Gershon Crane, MD

## 2021-10-01 ENCOUNTER — Other Ambulatory Visit: Payer: Self-pay

## 2021-10-01 ENCOUNTER — Ambulatory Visit: Payer: BC Managed Care – PPO | Admitting: Family Medicine

## 2021-10-01 VITALS — BP 140/80 | HR 62 | Temp 97.8°F | Wt 156.1 lb

## 2021-10-01 DIAGNOSIS — R300 Dysuria: Secondary | ICD-10-CM | POA: Diagnosis not present

## 2021-10-01 LAB — POCT URINALYSIS DIPSTICK
Bilirubin, UA: NEGATIVE
Blood, UA: POSITIVE
Glucose, UA: NEGATIVE
Ketones, UA: NEGATIVE
Nitrite, UA: NEGATIVE
Protein, UA: POSITIVE — AB
Spec Grav, UA: 1.015 (ref 1.010–1.025)
Urobilinogen, UA: 0.2 E.U./dL
pH, UA: 6 (ref 5.0–8.0)

## 2021-10-01 MED ORDER — CEPHALEXIN 500 MG PO CAPS: 500.0000 mg | ORAL_CAPSULE | Freq: Four times a day (QID) | ORAL | 0 refills | Status: DC

## 2021-10-01 NOTE — Progress Notes (Signed)
Established Patient Office Visit  Subjective:  Patient ID: [name], male    DOB: Dec 30, 1955  Age: 65 y.o. MRN: [id number]  CC:  Chief Complaint  Patient presents with   Urinary Tract Infection    Dysuria, urinary frequency, low urine volume    HPI [name] presents for onset of dysuria and urine frequency about 2 weeks ago.  Frequent burning with urination.  His worst day seem to be last Sunday and has had some steady improvement in symptoms since then.  Has also noticed some intermittent cloudy urine.  No history of STDs.  No gross hematuria.  No fevers or chills.  No flank pain.  No nausea or vomiting.  He had normal PSA with physical last year at 0.54.  Has upcoming physical scheduled for next month.  No prior history of UTI.  Symptoms are improved some today.  Past Medical History:  Diagnosis Date   Concussion 08-2007   Erectile dysfunction    Fracture of left hand    Hypertension     Past Surgical History:  Procedure Laterality Date   COLONOSCOPY  12-03-14   per Dr. Henrene Pastor, diverticulosis only, repeat in 10 yrs   LEFT HEART CATHETERIZATION WITH CORONARY ANGIOGRAM N/A 10/22/2014   Procedure: LEFT HEART CATHETERIZATION WITH CORONARY ANGIOGRAM;  Surgeon: Lorretta Harp, MD;  Location: [facility] CATH LAB;  Service: Cardiovascular;  Laterality: N/A;    Family History  Problem Relation Age of Onset   Arthritis Other    Coronary artery disease Other    Hypertension Other    Colon cancer Neg Hx    Esophageal cancer Neg Hx    Rectal cancer Neg Hx    Stomach cancer Neg Hx     Social History   Socioeconomic History   Marital status: Married    Spouse name: Not on file   Number of children: Not on file   Years of education: Not on file   Highest education level: Not on file  Occupational History   Occupation: truck driver  Tobacco Use   Smoking status: Every Day    Packs/day: 1.00    Years: 35.00    Pack years: 35.00    Types: Cigarettes   Smokeless  tobacco: Never   Tobacco comments:    1/2 pack per day  Vaping Use   Vaping Use: Never used  Substance and Sexual Activity   Alcohol use: Yes    Alcohol/week: 3.0 standard drinks    Types: 3 Cans of beer per week    Comment: occ a beer   Drug use: No   Sexual activity: Not on file  Other Topics Concern   Not on file  Social History Narrative   Not on file   Social Determinants of Health   Financial Resource Strain: Not on file  Food Insecurity: Not on file  Transportation Needs: Not on file  Physical Activity: Not on file  Stress: Not on file  Social Connections: Not on file  Intimate Partner Violence: Not on file    Outpatient Medications Prior to Visit  Medication Sig Dispense Refill   amLODipine (NORVASC) 10 MG tablet Take 1 tablet (10 mg total) by mouth daily. 90 tablet 3   cloNIDine (CATAPRES) 0.2 MG tablet Take 1 tablet (0.2 mg total) by mouth 2 (two) times daily. 180 tablet 3   Fish Oil OIL Take by mouth 2 (two) times daily.     fluticasone (FLONASE) 50 MCG/ACT nasal spray Place 2  sprays into both nostrils daily. 48 g 3   ketoconazole (NIZORAL) 2 % cream Apply 1 application topically 2 (two) times daily as needed for irritation. 30 g 2   losartan (COZAAR) 50 MG tablet Take 1 tablet (50 mg total) by mouth 2 (two) times daily. 180 tablet 3   metoprolol (TOPROL-XL) 200 MG 24 hr tablet Take 1 tablet (200 mg total) by mouth daily. 90 tablet 3   NEEDLE, DISP, 22 G (BD ECLIPSE SHIELDED NEEDLE) 22G X 1" MISC Use to inject 1 ML of B12 into the muscle, once a week for 12 weeks. 12 each 0   predniSONE (DELTASONE) 10 MG tablet Take 5 tabs a day for 5 days, then 4 a day for 5 days, then 3 a day for 5 days, then 2 a day for 5 days, then 1 a day for 5 days, then stop 75 tablet 0   tiZANidine (ZANAFLEX) 2 MG tablet Take 1-2 tablets (2-4 mg total) by mouth every 6 (six) hours as needed for muscle spasms. 60 tablet 1   traMADol (ULTRAM) 50 MG tablet Take 2 tablets (100 mg total) by mouth  at bedtime as needed. 30 tablet 0   triamcinolone cream (KENALOG) 0.1 % Apply topically 2 (two) times daily as needed. For jock itch 45 g 3   No facility-administered medications prior to visit.    Allergies  Allergen Reactions   Erythromycin     REACTION: Raw throat   Doxycycline Rash    ROS Review of Systems  Constitutional:  Negative for chills and fever.  Gastrointestinal:  Negative for abdominal pain.  Genitourinary:  Positive for dysuria and frequency. Negative for flank pain and hematuria.     Objective:    Physical Exam Vitals reviewed.  Cardiovascular:     Rate and Rhythm: Normal rate.  Pulmonary:     Effort: Pulmonary effort is normal.     Breath sounds: Normal breath sounds.  Neurological:     Mental Status: He is alert.    BP 140/80 (BP Location: Left Arm, Patient Position: Sitting, Cuff Size: Normal)   Pulse 62   Temp 97.8 F (36.6 C) (Oral)   Wt 156 lb 1.6 oz (70.8 kg)   SpO2 98%   BMI 24.45 kg/m  Wt Readings from Last 3 Encounters:  10/01/21 156 lb 1.6 oz (70.8 kg)  05/02/21 149 lb 6.4 oz (67.8 kg)  04/15/21 150 lb 9.6 oz (68.3 kg)     Health Maintenance Due  Topic Date Due   COVID-19 Vaccine (1) Never done   Pneumococcal Vaccine 78-26 Years old (1 - PCV) Never done   HIV Screening  Never done   Hepatitis C Screening  Never done   Zoster Vaccines- Shingrix (1 of 2) Never done   INFLUENZA VACCINE  06/23/2021    There are no preventive care reminders to display for this patient.  Lab Results  Component Value Date   TSH 1.07 11/01/2020   Lab Results  Component Value Date   WBC 5.5 11/01/2020   HGB 14.8 11/01/2020   HCT 42.7 11/01/2020   MCV 96.2 11/01/2020   PLT 304 11/01/2020   Lab Results  Component Value Date   NA 129 (L) 11/01/2020   K 5.0 11/01/2020   CO2 28 11/01/2020   GLUCOSE 99 11/01/2020   BUN 9 11/01/2020   CREATININE 0.74 11/01/2020   BILITOT 0.8 11/01/2020   ALKPHOS 86 10/17/2019   AST 19 11/01/2020   ALT 17  11/01/2020  PROT 6.4 11/01/2020   ALBUMIN 4.1 10/17/2019   CALCIUM 9.6 11/01/2020   ANIONGAP 15 10/22/2014   GFR 103.68 04/12/2020   Lab Results  Component Value Date   CHOL 159 11/01/2020   Lab Results  Component Value Date   HDL 49 11/01/2020   Lab Results  Component Value Date   LDLCALC 95 11/01/2020   Lab Results  Component Value Date   TRIG 67 11/01/2020   Lab Results  Component Value Date   CHOLHDL 3.2 11/01/2020   No results found for: HGBA1C    Assessment & Plan:   Problem List Items Addressed This Visit   None Visit Diagnoses     Dysuria    -  Primary   Relevant Orders   POCT urinalysis dipstick (Completed)   Urine Culture     Urine dipstick does reveal 3+ leukocytes and 1+ blood.  Urine culture sent.  Stay well-hydrated.  Start Keflex 500 mg 4 times daily for 7 days pending culture results.  Meds ordered this encounter  Medications   cephALEXin (KEFLEX) 500 MG capsule    Sig: Take 1 capsule (500 mg total) by mouth 4 (four) times daily.    Dispense:  28 capsule    Refill:  0    Follow-up: No follow-ups on file.    Carolann Littler, MD

## 2021-10-02 LAB — URINE CULTURE
MICRO NUMBER:: 12614955
SPECIMEN QUALITY:: ADEQUATE

## 2021-10-07 ENCOUNTER — Other Ambulatory Visit: Payer: Self-pay

## 2021-10-07 ENCOUNTER — Other Ambulatory Visit: Payer: Self-pay | Admitting: Family Medicine

## 2021-11-07 ENCOUNTER — Encounter: Payer: BC Managed Care – PPO | Admitting: Family Medicine

## 2021-11-13 ENCOUNTER — Telehealth: Payer: Self-pay | Admitting: Family Medicine

## 2021-11-13 MED ORDER — HYDROCODONE BIT-HOMATROP MBR 5-1.5 MG/5ML PO SOLN
5.0000 mL | ORAL | 0 refills | Status: DC | PRN
Start: 2021-11-13 — End: 2021-11-21

## 2021-11-13 NOTE — Telephone Encounter (Signed)
Pharmacy updated.   Please advise 

## 2021-11-13 NOTE — Telephone Encounter (Signed)
I sent in a bottle of cough syrup. We will see if he needs anything else at the [facility] tomorrow

## 2021-11-13 NOTE — Telephone Encounter (Signed)
Patient called because he would like something called in for his flu symptoms. Patient stated he is coughing excessively and his stomach is sore from it. Patient is scheduled for a telephone with Dr.Fry tomorrow at 8, but wants to know if it can be sent in early.   Please send to   [facility] [address] [place] - [address] [place] [address] Phone:  [phone]  Fax:  [phone]      Please advise

## 2021-11-13 NOTE — Telephone Encounter (Signed)
Spoke with [name] advised to pick up Rx from the pharmacy per Dr Clent Ridges, [name] verbalized understanding that Dr Clent Ridges will discuss any further needs at the [facility] tomorrow

## 2021-11-14 ENCOUNTER — Telehealth (INDEPENDENT_AMBULATORY_CARE_PROVIDER_SITE_OTHER): Payer: BC Managed Care – PPO | Admitting: Family Medicine

## 2021-11-14 ENCOUNTER — Encounter: Payer: Self-pay | Admitting: Family Medicine

## 2021-11-14 DIAGNOSIS — J069 Acute upper respiratory infection, unspecified: Secondary | ICD-10-CM | POA: Diagnosis not present

## 2021-11-14 MED ORDER — OSELTAMIVIR PHOSPHATE 75 MG PO CAPS
75.0000 mg | ORAL_CAPSULE | Freq: Two times a day (BID) | ORAL | 0 refills | Status: AC
Start: 2021-11-14 — End: 2021-11-19

## 2021-11-14 NOTE — Progress Notes (Signed)
° °  Subjective:    Patient ID: [name], male    DOB: 10-31-1956, 65 y.o.   MRN: [id number]  HPI Virtual Visit via Telephone Note  I connected with the patient on 11/14/21 at  8:00 AM EST by telephone and verified that I am speaking with the correct person using two identifiers.   I discussed the limitations, risks, security and privacy concerns of performing an evaluation and management service by telephone and the availability of in person appointments. I also discussed with the patient that there may be a patient responsible charge related to this service. The patient expressed understanding and agreed to proceed.  Location patient: home Location provider: work or home office Participants present for the call: patient, provider Patient did not have a visit in the prior 7 days to address this/these issue(s).   History of Present Illness: Here for 4 days f fever to 101 degrees, body aches, headache, and a dry cough. No ST or NVD. He tested negative for the Covid-19 virus 2 days ago. Drinking fluids and taking Advil.    Observations/Objective: Patient sounds cheerful and well on the phone. I do not appreciate any SOB. Speech and thought processing are grossly intact. Patient reported vitals:  Assessment and Plan: This is likely an influenza infection, so we will treat it with 5 days of Tamiflu. He has cough syrup to use as needed.  Gershon Crane, MD   Follow Up Instructions:     [phone] [phone] 11-20 [phone] 21-30 I did not refer this patient for an OV in the next 24 hours for this/these issue(s).  I discussed the assessment and treatment plan with the patient. The patient was provided an opportunity to ask questions and all were answered. The patient agreed with the plan and demonstrated an understanding of the instructions.   The patient was advised to call back or seek an in-person evaluation if the symptoms worsen or if the condition fails to improve as  anticipated.  I provided 15 minutes of non-face-to-face time during this encounter.   Gershon Crane, MD     Review of Systems     Objective:   Physical Exam        Assessment & Plan:

## 2021-11-21 ENCOUNTER — Ambulatory Visit (INDEPENDENT_AMBULATORY_CARE_PROVIDER_SITE_OTHER): Payer: BC Managed Care – PPO | Admitting: Family Medicine

## 2021-11-21 ENCOUNTER — Encounter: Payer: Self-pay | Admitting: Family Medicine

## 2021-11-21 ENCOUNTER — Other Ambulatory Visit: Payer: Self-pay

## 2021-11-21 VITALS — BP 110/68 | Temp 98.1°F | Ht 67.0 in | Wt 142.4 lb

## 2021-11-21 DIAGNOSIS — Z0000 Encounter for general adult medical examination without abnormal findings: Secondary | ICD-10-CM

## 2021-11-21 DIAGNOSIS — I10 Essential (primary) hypertension: Secondary | ICD-10-CM | POA: Diagnosis not present

## 2021-11-21 DIAGNOSIS — R7989 Other specified abnormal findings of blood chemistry: Secondary | ICD-10-CM

## 2021-11-21 LAB — HEPATIC FUNCTION PANEL
ALT: 32 U/L (ref 0–53)
AST: 21 U/L (ref 0–37)
Albumin: 3.7 g/dL (ref 3.5–5.2)
Alkaline Phosphatase: 65 U/L (ref 39–117)
Bilirubin, Direct: 0.4 mg/dL — ABNORMAL HIGH (ref 0.0–0.3)
Total Bilirubin: 1.2 mg/dL (ref 0.2–1.2)
Total Protein: 6.2 g/dL (ref 6.0–8.3)

## 2021-11-21 LAB — PSA: PSA: 0.51 ng/mL (ref 0.10–4.00)

## 2021-11-21 LAB — CBC WITH DIFFERENTIAL/PLATELET
Basophils Absolute: 0 10*3/uL (ref 0.0–0.1)
Basophils Relative: 0.2 % (ref 0.0–3.0)
Eosinophils Absolute: 0 10*3/uL (ref 0.0–0.7)
Eosinophils Relative: 0.4 % (ref 0.0–5.0)
HCT: 42 % (ref 39.0–52.0)
Hemoglobin: 14.4 g/dL (ref 13.0–17.0)
Lymphocytes Relative: 7.6 % — ABNORMAL LOW (ref 12.0–46.0)
Lymphs Abs: 0.8 10*3/uL (ref 0.7–4.0)
MCHC: 34.2 g/dL (ref 30.0–36.0)
MCV: 95.6 fl (ref 78.0–100.0)
Monocytes Absolute: 1 10*3/uL (ref 0.1–1.0)
Monocytes Relative: 8.8 % (ref 3.0–12.0)
Neutro Abs: 9.1 10*3/uL — ABNORMAL HIGH (ref 1.4–7.7)
Neutrophils Relative %: 83 % — ABNORMAL HIGH (ref 43.0–77.0)
Platelets: 347 10*3/uL (ref 150.0–400.0)
RBC: 4.39 Mil/uL (ref 4.22–5.81)
RDW: 13.2 % (ref 11.5–15.5)
WBC: 10.9 10*3/uL — ABNORMAL HIGH (ref 4.0–10.5)

## 2021-11-21 LAB — LIPID PANEL
Cholesterol: 139 mg/dL (ref 0–200)
HDL: 28.6 mg/dL — ABNORMAL LOW (ref >39.00)
LDL Cholesterol: 90 mg/dL (ref 0–99)
NonHDL: 109.94
Total CHOL/HDL Ratio: 5
Triglycerides: 100 mg/dL (ref 0.0–149.0)
VLDL: 20 mg/dL (ref 0.0–40.0)

## 2021-11-21 LAB — URINALYSIS
Bilirubin Urine: NEGATIVE
Hgb urine dipstick: NEGATIVE
Ketones, ur: NEGATIVE
Leukocytes,Ua: NEGATIVE
Nitrite: NEGATIVE
Specific Gravity, Urine: 1.02 (ref 1.000–1.030)
Urine Glucose: NEGATIVE
Urobilinogen, UA: 0.2 (ref 0.0–1.0)
pH: 6 (ref 5.0–8.0)

## 2021-11-21 LAB — BASIC METABOLIC PANEL
BUN: 39 mg/dL — ABNORMAL HIGH (ref 6–23)
CO2: 31 mEq/L (ref 19–32)
Calcium: 9.1 mg/dL (ref 8.4–10.5)
Chloride: 89 mEq/L — ABNORMAL LOW (ref 96–112)
Creatinine, Ser: 1.78 mg/dL — ABNORMAL HIGH (ref 0.40–1.50)
GFR: 39.87 mL/min — ABNORMAL LOW (ref >60.00)
Glucose, Bld: 107 mg/dL — ABNORMAL HIGH (ref 70–99)
Potassium: 3.7 mEq/L (ref 3.5–5.1)
Sodium: 133 mEq/L — ABNORMAL LOW (ref 135–145)

## 2021-11-21 LAB — HEMOGLOBIN A1C: Hgb A1c MFr Bld: 6.1 % (ref 4.6–6.5)

## 2021-11-21 LAB — TSH: TSH: 0.6 u[IU]/mL (ref 0.35–5.50)

## 2021-11-21 NOTE — Progress Notes (Signed)
Subjective:    Patient ID: [name], male    DOB: 03/06/1956, 65 y.o.   MRN: [id number]  HPI Here for a well exam. He is doing fine. He recently had a bout of what was likely influenza, but he is feeling better after taking a course of Tamiflu. He plans to retire in the next several weeks.    Review of Systems  Constitutional: Negative.   HENT: Negative.    Eyes: Negative.   Respiratory: Negative.    Cardiovascular: Negative.   Gastrointestinal: Negative.   Genitourinary: Negative.   Musculoskeletal: Negative.   Skin: Negative.   Neurological: Negative.   Psychiatric/Behavioral: Negative.        Objective:   Physical Exam Constitutional:      General: He is not in acute distress.    Appearance: Normal appearance. He is well-developed. He is not diaphoretic.  HENT:     Head: Normocephalic and atraumatic.     Right Ear: External ear normal.     Left Ear: External ear normal.     Nose: Nose normal.     Mouth/Throat:     Pharynx: No oropharyngeal exudate.  Eyes:     General: No scleral icterus.       Right eye: No discharge.        Left eye: No discharge.     Conjunctiva/sclera: Conjunctivae normal.     Pupils: Pupils are equal, round, and reactive to light.  Neck:     Thyroid: No thyromegaly.     Vascular: No JVD.     Trachea: No tracheal deviation.  Cardiovascular:     Rate and Rhythm: Normal rate and regular rhythm.     Heart sounds: Normal heart sounds. No murmur heard.   No friction rub. No gallop.  Pulmonary:     Effort: Pulmonary effort is normal. No respiratory distress.     Breath sounds: Normal breath sounds. No wheezing or rales.  Chest:     Chest wall: No tenderness.  Abdominal:     General: Bowel sounds are normal. There is no distension.     Palpations: Abdomen is soft. There is no mass.     Tenderness: There is no abdominal tenderness. There is no guarding or rebound.  Genitourinary:    Penis: Normal. No tenderness.      Testes:  Normal.     Prostate: Normal.     Rectum: Normal. Guaiac result negative.  Musculoskeletal:        General: No tenderness. Normal range of motion.     Cervical back: Neck supple.  Lymphadenopathy:     Cervical: No cervical adenopathy.  Skin:    General: Skin is warm and dry.     Coloration: Skin is not pale.     Findings: No erythema or rash.  Neurological:     Mental Status: He is alert and oriented to person, place, and time.     Cranial Nerves: No cranial nerve deficit.     Motor: No abnormal muscle tone.     Coordination: Coordination normal.     Deep Tendon Reflexes: Reflexes are normal and symmetric. Reflexes normal.  Psychiatric:        Behavior: Behavior normal.        Thought Content: Thought content normal.        Judgment: Judgment normal.          Assessment & Plan:  Well exam. We discussed diet and exercise. Get fasting labs.  Jeannett Senior  Sarajane Jews, MD

## 2021-11-21 NOTE — Addendum Note (Signed)
Addended by: Gershon Crane A on: 11/21/2021 03:43 PM   Modules accepted: Orders

## 2021-12-05 ENCOUNTER — Ambulatory Visit: Payer: BC Managed Care – PPO | Admitting: Family Medicine

## 2021-12-05 ENCOUNTER — Other Ambulatory Visit: Payer: BC Managed Care – PPO

## 2021-12-05 ENCOUNTER — Encounter: Payer: Self-pay | Admitting: Family Medicine

## 2021-12-05 VITALS — BP 126/80 | HR 54 | Temp 98.0°F | Wt 145.0 lb

## 2021-12-05 DIAGNOSIS — I10 Essential (primary) hypertension: Secondary | ICD-10-CM

## 2021-12-05 DIAGNOSIS — R7989 Other specified abnormal findings of blood chemistry: Secondary | ICD-10-CM | POA: Diagnosis not present

## 2021-12-05 LAB — BASIC METABOLIC PANEL
BUN: 16 mg/dL (ref 6–23)
CO2: 28 mEq/L (ref 19–32)
Calcium: 9 mg/dL (ref 8.4–10.5)
Chloride: 95 mEq/L — ABNORMAL LOW (ref 96–112)
Creatinine, Ser: 1.07 mg/dL (ref 0.40–1.50)
GFR: 73.41 mL/min (ref >60.00)
Glucose, Bld: 89 mg/dL (ref 70–99)
Potassium: 4.6 mEq/L (ref 3.5–5.1)
Sodium: 130 mEq/L — ABNORMAL LOW (ref 135–145)

## 2021-12-05 MED ORDER — METOPROLOL SUCCINATE ER 100 MG PO TB24: 100.0000 mg | ORAL_TABLET | Freq: Every day | ORAL | 3 refills | Status: DC

## 2021-12-05 MED ORDER — FLUTICASONE PROPIONATE 50 MCG/ACT NA SUSP: 2.0000 | Freq: Every day | NASAL | 3 refills | Status: DC

## 2021-12-05 NOTE — Progress Notes (Signed)
° °  Subjective:    Patient ID: [name], male    DOB: [birth year], 66 y.o.   MRN: [id number]  HPI Here with questions about his BP medications. Since he retired and is no longer dealing with job stresses, his BP has dropped quite a bit at home and his pulse is slower. He has been averaging BP of 100-110/60-70 and the HR has been 50-60. He feels fine. Also at his well exam his creatinine had jumped up to 1.78. We will be rechecking this today. He has been drinking plenty of water.    Review of Systems  Constitutional: Negative.   Respiratory: Negative.    Cardiovascular: Negative.       Objective:   Physical Exam Constitutional:      Appearance: Normal appearance.  Cardiovascular:     Rate and Rhythm: Regular rhythm. Bradycardia present.     Pulses: Normal pulses.     Heart sounds: Normal heart sounds.  Pulmonary:     Effort: Pulmonary effort is normal.     Breath sounds: Normal breath sounds.  Neurological:     Mental Status: He is alert.          Assessment & Plan:  His HTN is a bit over treated. We will decrease the Metoprolol succinate to 100 mg daily. Recheck the BMET. He will report back in 2 weeks. Gershon Crane, MD

## 2022-02-12 ENCOUNTER — Telehealth: Payer: Self-pay | Admitting: Family Medicine

## 2022-02-12 NOTE — Telephone Encounter (Signed)
Patient dropped off paperwork to be filled out by Dr.Fry. Form was attached and it was placed in file to be completed. ? ? ? ? ? ? ?Please advise  ?

## 2022-02-13 NOTE — Telephone Encounter (Signed)
Pt form was received and placed on Dr Clent Ridges red folder for completion, pt will be notified after that form is ready ?

## 2022-02-18 NOTE — Telephone Encounter (Signed)
Pt form is complete left a message for pt to pick up from the office ?

## 2022-04-28 ENCOUNTER — Other Ambulatory Visit: Payer: Self-pay | Admitting: Family Medicine

## 2022-05-06 ENCOUNTER — Other Ambulatory Visit: Payer: Self-pay | Admitting: Family Medicine

## 2022-05-22 ENCOUNTER — Ambulatory Visit: Payer: BC Managed Care – PPO | Admitting: Family Medicine

## 2022-06-02 ENCOUNTER — Encounter: Payer: Self-pay | Admitting: Family Medicine

## 2022-06-02 ENCOUNTER — Ambulatory Visit (INDEPENDENT_AMBULATORY_CARE_PROVIDER_SITE_OTHER): Payer: BC Managed Care – PPO | Admitting: Family Medicine

## 2022-06-02 VITALS — BP 128/82 | HR 63 | Temp 98.4°F | Wt 155.0 lb

## 2022-06-02 DIAGNOSIS — I10 Essential (primary) hypertension: Secondary | ICD-10-CM | POA: Diagnosis not present

## 2022-06-02 MED ORDER — TRIAMCINOLONE ACETONIDE 0.1 % EX CREA: TOPICAL_CREAM | Freq: Two times a day (BID) | CUTANEOUS | 5 refills | Status: AC | PRN

## 2022-06-02 MED ORDER — FEXOFENADINE HCL 180 MG PO TABS: 180.0000 mg | ORAL_TABLET | Freq: Every day | ORAL | 3 refills | Status: DC

## 2022-06-02 NOTE — Progress Notes (Signed)
   Subjective:    Patient ID: [name], male    DOB: August 18, 1956, 66 y.o.   MRN: [id number]  HPI Here to follow up on HTN. He feels great and his BP has been well controlled.    Review of Systems  Constitutional: Negative.   Respiratory: Negative.    Cardiovascular: Negative.   Neurological: Negative.        Objective:   Physical Exam Constitutional:      Appearance: Normal appearance.  Cardiovascular:     Rate and Rhythm: Normal rate and regular rhythm.     Pulses: Normal pulses.     Heart sounds: Normal heart sounds.  Pulmonary:     Effort: Pulmonary effort is normal.     Breath sounds: Normal breath sounds.  Musculoskeletal:     Comments: Trace edema in both ankles   Neurological:     General: No focal deficit present.     Mental Status: He is alert. Mental status is at baseline.           Assessment & Plan:  His HTN is stable. We will stay on the current regimen. Recheck in 6 months at his next well exam.  Gershon Crane, MD

## 2022-07-29 ENCOUNTER — Other Ambulatory Visit: Payer: Self-pay | Admitting: Family Medicine

## 2022-08-03 DIAGNOSIS — H524 Presbyopia: Secondary | ICD-10-CM | POA: Diagnosis not present

## 2022-08-03 DIAGNOSIS — Z0100 Encounter for examination of eyes and vision without abnormal findings: Secondary | ICD-10-CM | POA: Diagnosis not present

## 2022-08-06 ENCOUNTER — Other Ambulatory Visit: Payer: Self-pay | Admitting: Family Medicine

## 2022-08-11 ENCOUNTER — Other Ambulatory Visit: Payer: Self-pay | Admitting: Family Medicine

## 2022-08-26 DIAGNOSIS — H10023 Other mucopurulent conjunctivitis, bilateral: Secondary | ICD-10-CM | POA: Diagnosis not present

## 2022-10-26 ENCOUNTER — Other Ambulatory Visit: Payer: Self-pay | Admitting: Family Medicine

## 2022-11-02 ENCOUNTER — Other Ambulatory Visit: Payer: Self-pay | Admitting: Family Medicine

## 2022-11-15 ENCOUNTER — Other Ambulatory Visit: Payer: Self-pay | Admitting: Family Medicine

## 2022-11-24 ENCOUNTER — Ambulatory Visit (INDEPENDENT_AMBULATORY_CARE_PROVIDER_SITE_OTHER): Payer: BLUE CROSS/BLUE SHIELD | Admitting: Family Medicine

## 2022-11-24 ENCOUNTER — Encounter: Payer: Self-pay | Admitting: Family Medicine

## 2022-11-24 VITALS — BP 132/90 | HR 62 | Temp 97.8°F | Ht 67.0 in | Wt 160.4 lb

## 2022-11-24 DIAGNOSIS — Z23 Encounter for immunization: Secondary | ICD-10-CM

## 2022-11-24 DIAGNOSIS — Z0000 Encounter for general adult medical examination without abnormal findings: Secondary | ICD-10-CM | POA: Diagnosis not present

## 2022-11-24 LAB — BASIC METABOLIC PANEL
BUN: 12 mg/dL (ref 6–23)
CO2: 29 mEq/L (ref 19–32)
Calcium: 9.6 mg/dL (ref 8.4–10.5)
Chloride: 94 mEq/L — ABNORMAL LOW (ref 96–112)
Creatinine, Ser: 0.81 mg/dL (ref 0.40–1.50)
GFR: 92.63 mL/min (ref >60.00)
Glucose, Bld: 103 mg/dL — ABNORMAL HIGH (ref 70–99)
Potassium: 4.2 mEq/L (ref 3.5–5.1)
Sodium: 133 mEq/L — ABNORMAL LOW (ref 135–145)

## 2022-11-24 LAB — PSA: PSA: 0.61 ng/mL (ref 0.10–4.00)

## 2022-11-24 LAB — CBC WITH DIFFERENTIAL/PLATELET
Basophils Absolute: 0 10*3/uL (ref 0.0–0.1)
Basophils Relative: 0.4 % (ref 0.0–3.0)
Eosinophils Absolute: 0.1 10*3/uL (ref 0.0–0.7)
Eosinophils Relative: 2.4 % (ref 0.0–5.0)
HCT: 45.6 % (ref 39.0–52.0)
Hemoglobin: 15.7 g/dL (ref 13.0–17.0)
Lymphocytes Relative: 26 % (ref 12.0–46.0)
Lymphs Abs: 1.3 10*3/uL (ref 0.7–4.0)
MCHC: 34.5 g/dL (ref 30.0–36.0)
MCV: 97.8 fl (ref 78.0–100.0)
Monocytes Absolute: 0.5 10*3/uL (ref 0.1–1.0)
Monocytes Relative: 9.7 % (ref 3.0–12.0)
Neutro Abs: 3.2 10*3/uL (ref 1.4–7.7)
Neutrophils Relative %: 61.5 % (ref 43.0–77.0)
Platelets: 277 10*3/uL (ref 150.0–400.0)
RBC: 4.66 Mil/uL (ref 4.22–5.81)
RDW: 13.1 % (ref 11.5–15.5)
WBC: 5.2 10*3/uL (ref 4.0–10.5)

## 2022-11-24 LAB — LIPID PANEL
Cholesterol: 183 mg/dL (ref 0–200)
HDL: 48.5 mg/dL (ref >39.00)
LDL Cholesterol: 117 mg/dL — ABNORMAL HIGH (ref 0–99)
NonHDL: 134.46
Total CHOL/HDL Ratio: 4
Triglycerides: 87 mg/dL (ref 0.0–149.0)
VLDL: 17.4 mg/dL (ref 0.0–40.0)

## 2022-11-24 LAB — HEPATIC FUNCTION PANEL
ALT: 21 U/L (ref 0–53)
AST: 19 U/L (ref 0–37)
Albumin: 4.5 g/dL (ref 3.5–5.2)
Alkaline Phosphatase: 83 U/L (ref 39–117)
Bilirubin, Direct: 0.1 mg/dL (ref 0.0–0.3)
Total Bilirubin: 1 mg/dL (ref 0.2–1.2)
Total Protein: 6.8 g/dL (ref 6.0–8.3)

## 2022-11-24 LAB — HEMOGLOBIN A1C: Hgb A1c MFr Bld: 5.6 % (ref 4.6–6.5)

## 2022-11-24 LAB — TSH: TSH: 1.17 u[IU]/mL (ref 0.35–5.50)

## 2022-11-24 MED ORDER — METOPROLOL SUCCINATE ER 100 MG PO TB24: 100.0000 mg | ORAL_TABLET | Freq: Every day | ORAL | 3 refills | Status: DC

## 2022-11-24 MED ORDER — FLUTICASONE PROPIONATE 50 MCG/ACT NA SUSP: 2.0000 | Freq: Every day | NASAL | 3 refills | Status: DC

## 2022-11-24 NOTE — Progress Notes (Signed)
Subjective:    Patient ID: [name], male    DOB: 1956-08-26, 67 y.o.   MRN: [id number]  HPI Here for a well exam. He is doing well in general, although he has been experiencing a lot of itching the past few months. He has had hives in the past, but lately there has been no visible rash. He takes Fexofenidine as needed. He has scheduled to see Dr. Phillip Heal, a dermatologist in [place], on 12-01-22.    Review of Systems  Constitutional: Negative.   HENT: Negative.    Eyes: Negative.   Respiratory: Negative.    Cardiovascular: Negative.   Gastrointestinal: Negative.   Genitourinary: Negative.   Musculoskeletal: Negative.   Skin: Negative.   Neurological: Negative.   Psychiatric/Behavioral: Negative.         Objective:   Physical Exam Constitutional:      General: He is not in acute distress.    Appearance: Normal appearance. He is well-developed. He is not diaphoretic.  HENT:     Head: Normocephalic and atraumatic.     Right Ear: External ear normal.     Left Ear: External ear normal.     Nose: Nose normal.     Mouth/Throat:     Pharynx: No oropharyngeal exudate.  Eyes:     General: No scleral icterus.       Right eye: No discharge.        Left eye: No discharge.     Conjunctiva/sclera: Conjunctivae normal.     Pupils: Pupils are equal, round, and reactive to light.  Neck:     Thyroid: No thyromegaly.     Vascular: No JVD.     Trachea: No tracheal deviation.  Cardiovascular:     Rate and Rhythm: Normal rate and regular rhythm.     Heart sounds: Normal heart sounds. No murmur heard.    No friction rub. No gallop.  Pulmonary:     Effort: Pulmonary effort is normal. No respiratory distress.     Breath sounds: Normal breath sounds. No wheezing or rales.  Chest:     Chest wall: No tenderness.  Abdominal:     General: Bowel sounds are normal. There is no distension.     Palpations: Abdomen is soft. There is no mass.     Tenderness: There is no abdominal  tenderness. There is no guarding or rebound.  Genitourinary:    Penis: Normal. No tenderness.      Testes: Normal.     Prostate: Normal.     Rectum: Normal. Guaiac result negative.  Musculoskeletal:        General: No tenderness. Normal range of motion.     Cervical back: Neck supple.  Lymphadenopathy:     Cervical: No cervical adenopathy.  Skin:    General: Skin is warm and dry.     Coloration: Skin is not pale.     Findings: No erythema or rash.  Neurological:     Mental Status: He is alert and oriented to person, place, and time.     Cranial Nerves: No cranial nerve deficit.     Motor: No abnormal muscle tone.     Coordination: Coordination normal.     Deep Tendon Reflexes: Reflexes are normal and symmetric. Reflexes normal.  Psychiatric:        Behavior: Behavior normal.        Thought Content: Thought content normal.        Judgment: Judgment normal.  Assessment & Plan:  Well exam. We discussed diet and exercise. Get fasting labs. Alysia Penna, MD

## 2022-12-01 DIAGNOSIS — L508 Other urticaria: Secondary | ICD-10-CM | POA: Diagnosis not present

## 2023-01-12 DIAGNOSIS — L309 Dermatitis, unspecified: Secondary | ICD-10-CM | POA: Diagnosis not present

## 2023-01-12 DIAGNOSIS — R21 Rash and other nonspecific skin eruption: Secondary | ICD-10-CM | POA: Diagnosis not present

## 2023-01-12 DIAGNOSIS — L508 Other urticaria: Secondary | ICD-10-CM | POA: Diagnosis not present

## 2023-01-22 ENCOUNTER — Other Ambulatory Visit: Payer: Self-pay | Admitting: Family Medicine

## 2023-01-25 ENCOUNTER — Telehealth: Payer: Self-pay | Admitting: Family Medicine

## 2023-01-25 NOTE — Telephone Encounter (Signed)
Pt states he is out of amLODipine (NORVASC) 10 MG tablet and takes 1 a day, had a 90d script, can't understand why he is short.

## 2023-01-26 ENCOUNTER — Other Ambulatory Visit: Payer: Self-pay | Admitting: Family Medicine

## 2023-01-26 DIAGNOSIS — L905 Scar conditions and fibrosis of skin: Secondary | ICD-10-CM | POA: Diagnosis not present

## 2023-01-26 DIAGNOSIS — L2089 Other atopic dermatitis: Secondary | ICD-10-CM | POA: Diagnosis not present

## 2023-04-22 ENCOUNTER — Other Ambulatory Visit: Payer: Self-pay | Admitting: Family Medicine

## 2023-05-03 ENCOUNTER — Other Ambulatory Visit: Payer: Self-pay | Admitting: Family Medicine

## 2023-05-14 ENCOUNTER — Other Ambulatory Visit: Payer: Self-pay | Admitting: Family Medicine

## 2023-05-20 DIAGNOSIS — L2089 Other atopic dermatitis: Secondary | ICD-10-CM | POA: Diagnosis not present

## 2023-05-20 DIAGNOSIS — L570 Actinic keratosis: Secondary | ICD-10-CM | POA: Diagnosis not present

## 2023-05-25 ENCOUNTER — Encounter: Payer: Self-pay | Admitting: Family Medicine

## 2023-05-25 ENCOUNTER — Ambulatory Visit (INDEPENDENT_AMBULATORY_CARE_PROVIDER_SITE_OTHER): Payer: Medicare Other | Admitting: Family Medicine

## 2023-05-25 VITALS — BP 128/90 | HR 61 | Temp 97.8°F | Wt 160.0 lb

## 2023-05-25 DIAGNOSIS — M25471 Effusion, right ankle: Secondary | ICD-10-CM

## 2023-05-25 DIAGNOSIS — I10 Essential (primary) hypertension: Secondary | ICD-10-CM | POA: Diagnosis not present

## 2023-05-25 DIAGNOSIS — M25472 Effusion, left ankle: Secondary | ICD-10-CM

## 2023-05-25 MED ORDER — LOSARTAN POTASSIUM-HCTZ 50-12.5 MG PO TABS: 1.0000 | ORAL_TABLET | Freq: Every day | ORAL | 3 refills | Status: DC

## 2023-05-25 NOTE — Progress Notes (Signed)
   Subjective:    Patient ID: [name], male    DOB: September 11, 1956, 67 y.o.   MRN: [id number]  HPI Here to follow up on HTN. He feels well although he has noticed some mild swelling in his ankles the past 2 months. This is not uncomfortable. No SOB.    Review of Systems  Constitutional: Negative.   Respiratory: Negative.    Cardiovascular:  Positive for leg swelling. Negative for chest pain and palpitations.       Objective:   Physical Exam Constitutional:      Appearance: Normal appearance.  Cardiovascular:     Rate and Rhythm: Normal rate and regular rhythm.     Pulses: Normal pulses.     Heart sounds: Normal heart sounds.  Pulmonary:     Effort: Pulmonary effort is normal.     Breath sounds: Normal breath sounds.  Musculoskeletal:     Comments: 1+ edema in both ankles   Neurological:     Mental Status: He is alert.           Assessment & Plan:  His HTN is fairly well controlled, but we would like to get the diastolic readings down. He also has some mild ankle edema, so we will stop Losartan and switch to Losartan HCT 50-12.5 daily. Follow up in 6 months.  Gershon Crane, MD

## 2023-07-11 ENCOUNTER — Other Ambulatory Visit: Payer: Self-pay | Admitting: Family Medicine

## 2023-07-19 ENCOUNTER — Other Ambulatory Visit: Payer: Self-pay | Admitting: Family Medicine

## 2023-07-21 NOTE — Telephone Encounter (Signed)
Checking on progress of refill request

## 2023-07-29 ENCOUNTER — Telehealth (INDEPENDENT_AMBULATORY_CARE_PROVIDER_SITE_OTHER): Payer: Medicare Other | Admitting: Family Medicine

## 2023-07-29 VITALS — BP 115/70 | HR 60

## 2023-07-29 DIAGNOSIS — Z0000 Encounter for general adult medical examination without abnormal findings: Secondary | ICD-10-CM

## 2023-07-29 NOTE — Progress Notes (Signed)
PATIENT CHECK-IN and HEALTH RISK ASSESSMENT QUESTIONNAIRE:  -completed by phone/video for upcoming Medicare Preventive Visit  Pre-Visit Check-in: 1)Vitals (height, wt, BP, etc) - record in vitals section for visit on day of visit Request home vitals (wt, BP, etc.) and enter into vitals, THEN update Vital Signs SmartPhrase below at the top of the HPI. See below.  2)Review and Update Medications, Allergies PMH, Surgeries, Social history in Epic 3)Hospitalizations in the last year with date/reason?  none 4)Review and Update Care Team (patient's specialists) in Epic 5) Complete PHQ9 in Epic  6) Complete Fall Screening in Epic 7)Review all Health Maintenance Due and order under PCP if not done.  Medicare Wellness Patient Questionnaire:  Answer theses question about your habits: Do you drink alcohol? no Have you ever smoked? Has smoked for a long time, he has been cutting back over the last few months and is down to 4 per day now How many packs a day do/did you smoke?  Used to smoke 1ppd Do you exercises[place] has a dog Mostly eats at home, wife likes to cook, meat and veggies  Answer theses question about you: Can you perform most household chores? yes Do you find it hard to follow a conversation in a noisy room? Using hearing aids Do you often ask people to speak up or repeat themselves? Do you feel that you have a problem with memory? no Do you balance your checkbook and or bank acounts? yes Last dentist visit? dentures Do you need assistance with any of the following: Please note if so  - none  Driving?  Feeding yourself?  Getting from bed to chair?  Getting to the toilet?  Bathing or showering?  Dressing yourself?  Managing money?  Climbing a flight of stairs  Preparing meals?    Do you have Advanced Directives in place (Living Will, Healthcare Power or Attorney)?  yes   Last eye Exam and location? Goes to eye doctor annually, [facility]   Do  you currently use prescribed or non-prescribed narcotic or opioid pain medications? no  Do you have a history or close family history of breast, ovarian, tubal or peritoneal cancer or a family member with BRCA (breast cancer susceptibility 1 and 2) gene mutations?  Request home vitals (wt, BP, etc.) and enter into vitals, THEN update Vital Signs SmartPhrase below at the top of the HPI. See below.     ----------------------------------------------------------------------------------------------------------------------------------------------------------------------------------------------------------------------  Vital Signs: Vital signs are patient reported.   MEDICARE ANNUAL PREVENTIVE CARE VISIT WITH PROVIDER (Welcome to Medicare, initial annual wellness or annual wellness exam)  Virtual Visit via Video  Note  I connected with [name] on 07/29/23  by a video enabled telemedicine application and verified that I am speaking with the correct person using two identifiers.  Location patient: home Location provider:work or home office Persons participating in the virtual visit: patient, provider  Concerns and/or follow up today: none   See HM section in Epic for other details of completed HM.    ROS: negative for report of fevers, unintentional weight loss, vision changes, vision loss, hearing loss or change, chest pain, sob, hemoptysis, melena, hematochezia, hematuria, falls, bleeding or bruising, thoughts of suicide or self harm, memory loss  Patient-completed extensive health risk assessment - reviewed and discussed with the patient: See Health Risk Assessment completed with patient prior to the visit either above or in recent phone note. This was reviewed in detailed with the patient today and appropriate  recommendations, orders and referrals were placed as needed per Summary below and patient instructions.   Review of Medical History: -PMH, PSH, Family History and  current specialty and care providers reviewed and updated and listed below   Patient Care Team: Nelwyn Salisbury, MD as PCP - General   Past Medical History:  Diagnosis Date   Concussion 08-2007   Erectile dysfunction    Fracture of left hand    Hypertension     Past Surgical History:  Procedure Laterality Date   COLONOSCOPY  12-03-14   per Dr. Marina Goodell, diverticulosis only, repeat in 10 yrs   LEFT HEART CATHETERIZATION WITH CORONARY ANGIOGRAM N/A 10/22/2014   Procedure: LEFT HEART CATHETERIZATION WITH CORONARY ANGIOGRAM;  Surgeon: Runell Gess, MD;  Location: [facility] CATH LAB;  Service: Cardiovascular;  Laterality: N/A;    Social History   Socioeconomic History   Marital status: Married    Spouse name: Not on file   Number of children: Not on file   Years of education: Not on file   Highest education level: Not on file  Occupational History   Occupation: truck driver  Tobacco Use   Smoking status: Every Day    Current packs/day: 1.00    Average packs/day: 1 pack/day for 35.0 years (35.0 ttl pk-yrs)    Types: Cigarettes   Smokeless tobacco: Never   Tobacco comments:    1/2 pack per day  Vaping Use   Vaping status: Never Used  Substance and Sexual Activity   Alcohol use: Yes    Alcohol/week: 3.0 standard drinks of alcohol    Types: 3 Cans of beer per week    Comment: occ a beer   Drug use: No   Sexual activity: Not on file  Other Topics Concern   Not on file  Social History Narrative   Not on file   Social Determinants of Health   Financial Resource Strain: Patient Declined (06/01/2022)   Overall Financial Resource Strain (CARDIA)    Difficulty of Paying Living Expenses: Patient declined  Food Insecurity: Patient Declined (06/01/2022)   Hunger Vital Sign    Worried About Running Out of Food in the Last Year: Patient declined    Ran Out of Food in the Last Year: Patient declined  Transportation Needs: Patient Declined (06/01/2022)   PRAPARE - Doctor, general practice (Medical): Patient declined    Lack of Transportation (Non-Medical): Patient declined  Physical Activity: Sufficiently Active (06/01/2022)   Exercise Vital Sign    Days of Exercise per Week: 5 days    Minutes of Exercise per Session: 60 min  Stress: No Stress Concern Present (06/01/2022)   [employer] of Occupational Health - Occupational Stress Questionnaire    Feeling of Stress : Not at all  Social Connections: Unknown (06/01/2022)   Social Connection and Isolation Panel [NHANES]    Frequency of Communication with Friends and Family: More than three times a week    Frequency of Social Gatherings with Friends and Family: More than three times a week    Attends Religious Services: Patient declined    Database administrator or Organizations: Patient declined    Attends Banker Meetings: Not on file    Marital Status: Married  Intimate Partner Violence: Unknown (02/23/2022)   Received from [facility]   HITS    Physically Hurt: Not on file    Insult or Talk Down To: Not on file    Threaten Physical Harm: Not  on file    Scream or Curse: Not on file    Family History  Problem Relation Age of Onset   Arthritis Other    Coronary artery disease Other    Hypertension Other    Colon cancer Neg Hx    Esophageal cancer Neg Hx    Rectal cancer Neg Hx    Stomach cancer Neg Hx     Current Outpatient Medications on File Prior to Visit  Medication Sig Dispense Refill   amLODipine (NORVASC) 10 MG tablet Take 1 tablet by mouth once daily 90 tablet 0   cloNIDine (CATAPRES) 0.2 MG tablet Take 1 tablet by mouth twice daily 180 tablet 0   fexofenadine (ALLEGRA) 180 MG tablet Take 1 tablet by mouth once daily 66 tablet 0   Fish Oil OIL Take by mouth 2 (two) times daily.     fluticasone (FLONASE) 50 MCG/ACT nasal spray Place 2 sprays into both nostrils daily. 48 g 3   ketoconazole (NIZORAL) 2 % cream Apply 1 application topically 2 (two) times daily as needed for  irritation. 30 g 2   levocetirizine (XYZAL) 5 MG tablet Take 5 mg by mouth daily.     losartan-hydrochlorothiazide (HYZAAR) 50-12.5 MG tablet Take 1 tablet by mouth daily. 90 tablet 3   metoprolol succinate (TOPROL-XL) 100 MG 24 hr tablet Take 1 tablet (100 mg total) by mouth daily. Take with or immediately following a meal. 90 tablet 3   NEEDLE, DISP, 22 G (BD ECLIPSE SHIELDED NEEDLE) 22G X 1" MISC Use to inject 1 ML of B12 into the muscle, once a week for 12 weeks. 12 each 0   tiZANidine (ZANAFLEX) 2 MG tablet Take 1-2 tablets (2-4 mg total) by mouth every 6 (six) hours as needed for muscle spasms. 60 tablet 1   traMADol (ULTRAM) 50 MG tablet Take 2 tablets (100 mg total) by mouth at bedtime as needed. 30 tablet 0   triamcinolone cream (KENALOG) 0.1 % Apply topically 2 (two) times daily as needed. For jock itch 45 g 5   No current facility-administered medications on file prior to visit.    Allergies  Allergen Reactions   Erythromycin     REACTION: Raw throat   Doxycycline Rash       Physical Exam Vitals requested from patient and listed below if patient had equipment and was able to obtain at home for this virtual visit: Vitals:   07/29/23 1744  BP: 115/70  Pulse: 60   Estimated body mass index is 25.06 kg/m as calculated from the following:   Height as of 11/24/22: 5\' 7"  (1.702 m).   Weight as of 05/25/23: 160 lb (72.6 kg).  EKG (optional): deferred due to virtual visit  GENERAL: alert, oriented, no acute distress detected; full vision exam deferred due to pandemic and/or virtual encounter  HEENT: atraumatic, conjunttiva clear, no obvious abnormalities on inspection of external nose and ears  NECK: normal movements of the head and neck  LUNGS: on inspection no signs of respiratory distress, breathing rate appears normal, no obvious gross SOB, gasping or wheezing  CV: no obvious cyanosis  MS: moves all visible extremities without noticeable abnormality  PSYCH/NEURO:  pleasant and cooperative, no obvious depression or anxiety, speech and thought processing grossly intact, Cognitive function grossly intact  [employer] Office Visit from 11/24/2022 in [facility] [place]  PHQ-9 Total Score 0           07/29/2023    5:46 PM 11/24/2022  8:59 AM 06/02/2022   11:05 AM 12/05/2021    9:48 AM 11/21/2021    8:29 AM  Depression screen PHQ 2/9  Decreased Interest 0 0 0 0 0  Down, Depressed, Hopeless 0 0 0 0 0  PHQ - 2 Score 0 0 0 0 0  Altered sleeping  0 0 0 0  Tired, decreased energy  0 0 0 0  Change in appetite  0 0 0 0  Feeling bad or failure about yourself   0 0 0 0  Trouble concentrating  0 0 0 0  Moving slowly or fidgety/restless  0 0 0 0  Suicidal thoughts  0 0 0 0  PHQ-9 Score  0 0 0 0  Difficult doing work/chores  Not difficult at all Not difficult at all  Not difficult at all       11/21/2021    8:29 AM 12/05/2021    9:48 AM 06/02/2022   11:04 AM 11/24/2022    8:59 AM 07/29/2023    5:46 PM  Fall Risk  Falls in the past year? 0 0 0 0 0  Was there an injury with Fall? 0 0 0 0 0  Fall Risk Category Calculator 0 0 0 0 0  Fall Risk Category (Retired) Low Low Low Low   (RETIRED) Patient Fall Risk Level Low fall risk Low fall risk Low fall risk Low fall risk   Patient at Risk for Falls Due to No Fall Risks No Fall Risks No Fall Risks No Fall Risks   Fall risk Follow up   Falls evaluation completed Falls evaluation completed      SUMMARY AND PLAN:  Encounter for Medicare annual wellness exam  Discussed applicable health maintenance/preventive health measures and advised and referred or ordered per patient preferences: -discussed lung cancer screening, he declined -discussed vaccines due and where could get each -declined shingles vaccine Health Maintenance  Topic Date Due   Hepatitis C Screening  Never done   Lung Cancer Screening  Never done   INFLUENZA VACCINE  06/24/2023   COVID-19 Vaccine (1 - 2023-24 season)  Never done   Zoster Vaccines- Shingrix (1 of 2) 12/24/2023 (Originally 07/23/2007)   Medicare Annual Wellness (AWV)  07/28/2024   Colonoscopy  12/03/2024   DTaP/Tdap/Td (2 - Td or Tdap) 10/16/2029   Pneumonia Vaccine 36+ Years old  Completed   HPV VACCINES  Aged Out      Education and counseling on the following was provided based on the above review of health and a plan/checklist for the patient, along with additional information discussed, was provided for the patient in the patient instructions :  - -Advised and counseled on a healthy lifestyle - including the importance of a healthy diet, regular physical activity, social connections and stress management. -Reviewed patient's current diet. Advised and counseled on a whole foods based healthy diet. A summary of a healthy diet was provided in the Patient Instructions.  -reviewed patient's current physical activity level and discussed exercise guidelines for adults. Encouraged at least 20-30 minutes per day of activity and encouraged to stay active. Resources and exercises guidelines provided, see patient instructions -Advise yearly dental visits at minimum and regular eye exams -counseled on tobacco use, congratulated on progress, advised to quit, suggested using nicotine gum or lozenge for cravings, discussed lung cancer screening and advised, he declined for now. Discussed what to do if quits but relapses.   Follow up: see patient instructions   Patient Instructions  I really enjoyed  getting to talk with you today! I am available on Tuesdays and Thursdays for virtual visits if you have any questions or concerns, or if I can be of any further assistance.   CHECKLIST FROM ANNUAL WELLNESS VISIT:  -Follow up (please call to schedule if not scheduled after visit):   -yearly for annual wellness visit with primary care office  Here is a list of your preventive care/health maintenance measures and the plan for each if any are due:  PLAN  For any measures below that may be due:  -please call Dr. Claris Che office if you decide you wish to do lunch cancer screening -can get vaccines at the pharmacy  Health Maintenance  Topic Date Due   Hepatitis C Screening  Never done   Lung Cancer Screening  Never done   INFLUENZA VACCINE  06/24/2023   COVID-19 Vaccine (1 - 2023-24 season) Never done   Zoster Vaccines- Shingrix (1 of 2) 12/24/2023 (Originally 07/23/2007)   Medicare Annual Wellness (AWV)  07/28/2024   Colonoscopy  12/03/2024   DTaP/Tdap/Td (2 - Td or Tdap) 10/16/2029   Pneumonia Vaccine 61+ Years old  Completed   HPV VACCINES  Aged Out    -See a dentist at least yearly  -Get your eyes checked and then per your eye specialist's recommendations  -Other issues addressed today:    I am so glad you have cut back on smoking and hope you can completely quit soon. The nicotine gum or lozenge may be helpful if any cravings when you quit. Can cal 1-800-QUIT-NOW for a further help or schedule visit with me or Dr. Clent Ridges if you need any help with quitting.   -I have included below further information regarding a healthy whole foods based diet, physical activity guidelines for adults, stress management and opportunities for social connections. I hope you find this information useful.   -----------------------------------------------------------------------------------------------------------------------------------------------------------------------------------------------------------------------------------------------------------  NUTRITION: -eat real food: lots of colorful vegetables (half the plate) and fruits -5-7 servings of vegetables and fruits per day (fresh or steamed is best), exp. 2 servings of vegetables with lunch and dinner and 2 servings of fruit per day. Berries and greens such as kale and collards are great choices.  -consume on a regular basis: whole grains (make sure first ingredient on label contains the word  "whole"), fresh fruits, fish, nuts, seeds, healthy oils (such as olive oil, avocado oil, grape seed oil) -may eat small amounts of dairy and lean meat on occasion, but avoid processed meats such as ham, bacon, lunch meat, etc. -drink water -try to avoid fast food and pre-packaged foods, processed meat -most experts advise limiting sodium to < 2300mg  per day, should limit further is any chronic conditions such as high blood pressure, heart disease, diabetes, etc. The American Heart Association advised that < 1500mg  is is ideal -try to avoid foods that contain any ingredients with names you do not recognize  -try to avoid sugar/sweets (except for the natural sugar that occurs in fresh fruit) -try to avoid sweet drinks -try to avoid white rice, white bread, pasta (unless whole grain), white or yellow potatoes  EXERCISE GUIDELINES FOR ADULTS: -if you wish to increase your physical activity, do so gradually and with the approval of your doctor -STOP and seek medical care immediately if you have any chest pain, chest discomfort or trouble breathing when starting or increasing exercise  -move and stretch your body, legs, feet and arms when sitting for long periods -Physical activity guidelines for optimal health in adults: -least  150 minutes per week of aerobic exercise (can talk, but not sing) once approved by your doctor, 20-30 minutes of sustained activity or two 10 minute episodes of sustained activity every day.  -resistance training at least 2 days per week if approved by your doctor -balance exercises 3+ days per week:   Stand somewhere where you have something sturdy to hold onto if you lose balance.    1) lift up on toes, start with 5x per day and work up to 20x   2) stand and lift on leg straight out to the side so that foot is a few inches of the floor, start with 5x each side and work up to 20x each side   3) stand on one foot, start with 5 seconds each side and work up to 20 seconds on  each side  If you need ideas or help with getting more active:  -Silver sneakers https://tools.silversneakers.com  -Walk with a Doc: http://www.duncan-williams.com/  -try to include resistance (weight lifting/strength building) and balance exercises twice per week: or the following link for ideas: http://castillo-powell.com/  BuyDucts.dk  STRESS MANAGEMENT: -can try meditating, or just sitting quietly with deep breathing while intentionally relaxing all parts of your body for 5 minutes daily -if you need further help with stress, anxiety or depression please follow up with your primary doctor or contact the wonderful folks at [facility]: [phone]  SOCIAL CONNECTIONS: -options in [place] if you wish to engage in more social and exercise related activities:  -Silver sneakers https://tools.silversneakers.com  -Walk with a Doc: http://www.duncan-williams.com/  -Check out the [facility] Active Adults 50+ section on the [place] [employer] (hiking clubs, book clubs, cards and games, chess, exercise classes, aquatic classes and much more) - see the website for details: https://www.[place].gov/departments/parks-recreation/active-adults50  -YouTube has lots of exercise videos for different ages and abilities as well  -[facility] (a variety of indoor and outdoor inperson activities for adults). 562-400-5731. [address]  -Virtual Online Classes (a variety of topics): see seniorplanet.org or call 6084427043  -consider volunteering at a school, hospice center, church, senior center or elsewhere           Terressa Koyanagi, DO

## 2023-07-29 NOTE — Patient Instructions (Addendum)
I really enjoyed getting to talk with you today! I am available on Tuesdays and Thursdays for virtual visits if you have any questions or concerns, or if I can be of any further assistance.   CHECKLIST FROM ANNUAL WELLNESS VISIT:  -Follow up (please call to schedule if not scheduled after visit):   -yearly for annual wellness visit with primary care office  Here is a list of your preventive care/health maintenance measures and the plan for each if any are due:  PLAN For any measures below that may be due:  -please call Dr. Claris Che office if you decide you wish to do lunch cancer screening -can get vaccines at the pharmacy  Health Maintenance  Topic Date Due   Hepatitis C Screening  Never done   Lung Cancer Screening  Never done   INFLUENZA VACCINE  06/24/2023   COVID-19 Vaccine (1 - 2023-24 season) Never done   Zoster Vaccines- Shingrix (1 of 2) 12/24/2023 (Originally 07/23/2007)   Medicare Annual Wellness (AWV)  07/28/2024   Colonoscopy  12/03/2024   DTaP/Tdap/Td (2 - Td or Tdap) 10/16/2029   Pneumonia Vaccine 76+ Years old  Completed   HPV VACCINES  Aged Out    -See a dentist at least yearly  -Get your eyes checked and then per your eye specialist's recommendations  -Other issues addressed today:    I am so glad you have cut back on smoking and hope you can completely quit soon. The nicotine gum or lozenge may be helpful if any cravings when you quit. Can cal 1-800-QUIT-NOW for a further help or schedule visit with me or Dr. Clent Ridges if you need any help with quitting.   -I have included below further information regarding a healthy whole foods based diet, physical activity guidelines for adults, stress management and opportunities for social connections. I hope you find this information useful.    -----------------------------------------------------------------------------------------------------------------------------------------------------------------------------------------------------------------------------------------------------------  NUTRITION: -eat real food: lots of colorful vegetables (half the plate) and fruits -5-7 servings of vegetables and fruits per day (fresh or steamed is best), exp. 2 servings of vegetables with lunch and dinner and 2 servings of fruit per day. Berries and greens such as kale and collards are great choices.  -consume on a regular basis: whole grains (make sure first ingredient on label contains the word "whole"), fresh fruits, fish, nuts, seeds, healthy oils (such as olive oil, avocado oil, grape seed oil) -may eat small amounts of dairy and lean meat on occasion, but avoid processed meats such as ham, bacon, lunch meat, etc. -drink water -try to avoid fast food and pre-packaged foods, processed meat -most experts advise limiting sodium to < 2300mg  per day, should limit further is any chronic conditions such as high blood pressure, heart disease, diabetes, etc. The American Heart Association advised that < 1500mg  is is ideal -try to avoid foods that contain any ingredients with names you do not recognize  -try to avoid sugar/sweets (except for the natural sugar that occurs in fresh fruit) -try to avoid sweet drinks -try to avoid white rice, white bread, pasta (unless whole grain), white or yellow potatoes  EXERCISE GUIDELINES FOR ADULTS: -if you wish to increase your physical activity, do so gradually and with the approval of your doctor -STOP and seek medical care immediately if you have any chest pain, chest discomfort or trouble breathing when starting or increasing exercise  -move and stretch your body, legs, feet and arms when sitting for long periods -Physical activity guidelines for optimal health in  adults: -least 150 minutes per week of  aerobic exercise (can talk, but not sing) once approved by your doctor, 20-30 minutes of sustained activity or two 10 minute episodes of sustained activity every day.  -resistance training at least 2 days per week if approved by your doctor -balance exercises 3+ days per week:   Stand somewhere where you have something sturdy to hold onto if you lose balance.    1) lift up on toes, start with 5x per day and work up to 20x   2) stand and lift on leg straight out to the side so that foot is a few inches of the floor, start with 5x each side and work up to 20x each side   3) stand on one foot, start with 5 seconds each side and work up to 20 seconds on each side  If you need ideas or help with getting more active:  -Silver sneakers https://tools.silversneakers.com  -Walk with a Doc: http://www.duncan-williams.com/  -try to include resistance (weight lifting/strength building) and balance exercises twice per week: or the following link for ideas: http://castillo-powell.com/  BuyDucts.dk  STRESS MANAGEMENT: -can try meditating, or just sitting quietly with deep breathing while intentionally relaxing all parts of your body for 5 minutes daily -if you need further help with stress, anxiety or depression please follow up with your primary doctor or contact the wonderful folks at [facility]: [phone]  SOCIAL CONNECTIONS: -options in [place] if you wish to engage in more social and exercise related activities:  -Silver sneakers https://tools.silversneakers.com  -Walk with a Doc: http://www.duncan-williams.com/  -Check out the [facility] Active Adults 50+ section on the [place] [employer] (hiking clubs, book clubs, cards and games, chess, exercise classes, aquatic classes and much more) - see the website for  details: https://www.[place].gov/departments/parks-recreation/active-adults50  -YouTube has lots of exercise videos for different ages and abilities as well  -[facility] (a variety of indoor and outdoor inperson activities for adults). [phone]. [address]  -Virtual Online Classes (a variety of topics): see seniorplanet.org or call [phone]  -consider volunteering at a school, hospice center, church, senior center or elsewhere

## 2023-08-02 ENCOUNTER — Other Ambulatory Visit: Payer: Self-pay | Admitting: Family Medicine

## 2023-08-05 ENCOUNTER — Other Ambulatory Visit: Payer: Self-pay

## 2023-08-05 MED ORDER — AMLODIPINE BESYLATE 10 MG PO TABS: 10.0000 mg | ORAL_TABLET | Freq: Every day | ORAL | 0 refills | Status: DC

## 2023-09-14 ENCOUNTER — Other Ambulatory Visit: Payer: Self-pay | Admitting: Family Medicine

## 2023-11-01 ENCOUNTER — Other Ambulatory Visit: Payer: Self-pay | Admitting: Family Medicine

## 2023-11-11 ENCOUNTER — Other Ambulatory Visit: Payer: Self-pay | Admitting: Family Medicine

## 2023-11-25 ENCOUNTER — Other Ambulatory Visit: Payer: Self-pay | Admitting: Family Medicine

## 2023-11-30 ENCOUNTER — Encounter: Payer: Self-pay | Admitting: Family Medicine

## 2023-11-30 ENCOUNTER — Ambulatory Visit (INDEPENDENT_AMBULATORY_CARE_PROVIDER_SITE_OTHER): Payer: Medicare Other | Admitting: Family Medicine

## 2023-11-30 VITALS — BP 140/86 | HR 62 | Temp 97.9°F | Ht 67.0 in | Wt 161.8 lb

## 2023-11-30 DIAGNOSIS — M25472 Effusion, left ankle: Secondary | ICD-10-CM | POA: Diagnosis not present

## 2023-11-30 DIAGNOSIS — N401 Enlarged prostate with lower urinary tract symptoms: Secondary | ICD-10-CM

## 2023-11-30 DIAGNOSIS — I10 Essential (primary) hypertension: Secondary | ICD-10-CM

## 2023-11-30 DIAGNOSIS — R739 Hyperglycemia, unspecified: Secondary | ICD-10-CM | POA: Diagnosis not present

## 2023-11-30 DIAGNOSIS — K219 Gastro-esophageal reflux disease without esophagitis: Secondary | ICD-10-CM | POA: Diagnosis not present

## 2023-11-30 DIAGNOSIS — I739 Peripheral vascular disease, unspecified: Secondary | ICD-10-CM

## 2023-11-30 DIAGNOSIS — M25471 Effusion, right ankle: Secondary | ICD-10-CM

## 2023-11-30 DIAGNOSIS — N138 Other obstructive and reflux uropathy: Secondary | ICD-10-CM | POA: Diagnosis not present

## 2023-11-30 LAB — CBC WITH DIFFERENTIAL/PLATELET
Basophils Absolute: 0 10*3/uL (ref 0.0–0.1)
Basophils Relative: 1 % (ref 0.0–3.0)
Eosinophils Absolute: 0.1 10*3/uL (ref 0.0–0.7)
Eosinophils Relative: 2 % (ref 0.0–5.0)
HCT: 44.3 % (ref 39.0–52.0)
Hemoglobin: 15.3 g/dL (ref 13.0–17.0)
Lymphocytes Relative: 24.8 % (ref 12.0–46.0)
Lymphs Abs: 1.2 10*3/uL (ref 0.7–4.0)
MCHC: 34.5 g/dL (ref 30.0–36.0)
MCV: 97.7 fL (ref 78.0–100.0)
Monocytes Absolute: 0.5 10*3/uL (ref 0.1–1.0)
Monocytes Relative: 10.1 % (ref 3.0–12.0)
Neutro Abs: 3 10*3/uL (ref 1.4–7.7)
Neutrophils Relative %: 62.1 % (ref 43.0–77.0)
Platelets: 272 10*3/uL (ref 150.0–400.0)
RBC: 4.54 Mil/uL (ref 4.22–5.81)
RDW: 13.8 % (ref 11.5–15.5)
WBC: 4.8 10*3/uL (ref 4.0–10.5)

## 2023-11-30 LAB — HEPATIC FUNCTION PANEL
ALT: 21 U/L (ref 0–53)
AST: 18 U/L (ref 0–37)
Albumin: 4.4 g/dL (ref 3.5–5.2)
Alkaline Phosphatase: 84 U/L (ref 39–117)
Bilirubin, Direct: 0.2 mg/dL (ref 0.0–0.3)
Total Bilirubin: 0.9 mg/dL (ref 0.2–1.2)
Total Protein: 6.3 g/dL (ref 6.0–8.3)

## 2023-11-30 LAB — BASIC METABOLIC PANEL
BUN: 11 mg/dL (ref 6–23)
CO2: 30 meq/L (ref 19–32)
Calcium: 9.2 mg/dL (ref 8.4–10.5)
Chloride: 90 meq/L — ABNORMAL LOW (ref 96–112)
Creatinine, Ser: 0.76 mg/dL (ref 0.40–1.50)
GFR: 93.76 mL/min (ref >60.00)
Glucose, Bld: 104 mg/dL — ABNORMAL HIGH (ref 70–99)
Potassium: 4.3 meq/L (ref 3.5–5.1)
Sodium: 127 meq/L — ABNORMAL LOW (ref 135–145)

## 2023-11-30 LAB — TSH: TSH: 0.81 u[IU]/mL (ref 0.35–5.50)

## 2023-11-30 LAB — LIPID PANEL
Cholesterol: 172 mg/dL (ref 0–200)
HDL: 39.9 mg/dL (ref >39.00)
LDL Cholesterol: 117 mg/dL — ABNORMAL HIGH (ref 0–99)
NonHDL: 132.14
Total CHOL/HDL Ratio: 4
Triglycerides: 75 mg/dL (ref 0.0–149.0)
VLDL: 15 mg/dL (ref 0.0–40.0)

## 2023-11-30 LAB — PSA: PSA: 0.85 ng/mL (ref 0.10–4.00)

## 2023-11-30 LAB — HEMOGLOBIN A1C: Hgb A1c MFr Bld: 5.9 % (ref 4.6–6.5)

## 2023-11-30 MED ORDER — AMLODIPINE BESYLATE 10 MG PO TABS: 10.0000 mg | ORAL_TABLET | Freq: Every day | ORAL | 3 refills | Status: DC

## 2023-11-30 MED ORDER — FEXOFENADINE HCL 180 MG PO TABS: 180.0000 mg | ORAL_TABLET | Freq: Every evening | ORAL | 3 refills | Status: AC

## 2023-11-30 MED ORDER — METOPROLOL SUCCINATE ER 100 MG PO TB24: 100.0000 mg | ORAL_TABLET | Freq: Every day | ORAL | 3 refills | Status: DC

## 2023-11-30 MED ORDER — CLONIDINE HCL 0.2 MG PO TABS: 0.2000 mg | ORAL_TABLET | Freq: Two times a day (BID) | ORAL | 3 refills | Status: DC

## 2023-11-30 MED ORDER — LOSARTAN POTASSIUM-HCTZ 50-12.5 MG PO TABS: 1.0000 | ORAL_TABLET | Freq: Every day | ORAL | 3 refills | Status: DC

## 2023-11-30 NOTE — Progress Notes (Addendum)
 Subjective:    Patient ID: [name], male    DOB: 13-May-1956, 68 y.o.   MRN: [id number]  HPI Here to follow up on issues. He feels well. He recently saw a Dermatologist who diagnosed him with atopic dermatitis. He takes an antihistamine BID  and applies moisturizing lotions. His BP has been stable. His GERD is stable. His ankle edema is stable.    Review of Systems  Constitutional: Negative.   HENT: Negative.    Eyes: Negative.   Respiratory: Negative.    Cardiovascular: Negative.   Gastrointestinal: Negative.   Genitourinary: Negative.   Musculoskeletal: Negative.   Skin: Negative.   Neurological: Negative.   Psychiatric/Behavioral: Negative.         Objective:   Physical Exam Constitutional:      General: He is not in acute distress.    Appearance: Normal appearance. He is well-developed. He is not diaphoretic.  HENT:     Head: Normocephalic and atraumatic.     Right Ear: External ear normal.     Left Ear: External ear normal.     Nose: Nose normal.     Mouth/Throat:     Pharynx: No oropharyngeal exudate.  Eyes:     General: No scleral icterus.       Right eye: No discharge.        Left eye: No discharge.     Conjunctiva/sclera: Conjunctivae normal.     Pupils: Pupils are equal, round, and reactive to light.  Neck:     Thyroid : No thyromegaly.     Vascular: No JVD.     Trachea: No tracheal deviation.  Cardiovascular:     Rate and Rhythm: Normal rate and regular rhythm.     Heart sounds: Normal heart sounds. No murmur heard.    No friction rub. No gallop.  Pulmonary:     Effort: Pulmonary effort is normal. No respiratory distress.     Breath sounds: Normal breath sounds. No wheezing or rales.  Chest:     Chest wall: No tenderness.  Abdominal:     General: Bowel sounds are normal. There is no distension.     Palpations: Abdomen is soft. There is no mass.     Tenderness: There is no abdominal tenderness. There is no guarding or rebound.   Genitourinary:    Penis: Normal. No tenderness.      Testes: Normal.     Prostate: Normal.     Rectum: Normal. Guaiac result negative.  Musculoskeletal:        General: No tenderness. Normal range of motion.     Cervical back: Neck supple.  Lymphadenopathy:     Cervical: No cervical adenopathy.  Skin:    General: Skin is warm and dry.     Coloration: Skin is not pale.     Findings: No erythema or rash.  Neurological:     Mental Status: He is alert and oriented to person, place, and time.     Cranial Nerves: No cranial nerve deficit.     Motor: No abnormal muscle tone.     Coordination: Coordination normal.     Deep Tendon Reflexes: Reflexes are normal and symmetric. Reflexes normal.  Psychiatric:        Behavior: Behavior normal.        Thought Content: Thought content normal.        Judgment: Judgment normal.           Assessment & Plan:  His HTN and PAD  and ankle edema are stable. His BPH is stable. His atopic dermatitis is now controlled. The GERD is stable. Get fasting labs for lipids, etc. We spent a total of (33   ) minutes reviewing records and discussing these issues.   Garnette Olmsted, MD

## 2023-12-08 DIAGNOSIS — H524 Presbyopia: Secondary | ICD-10-CM | POA: Diagnosis not present

## 2023-12-29 ENCOUNTER — Other Ambulatory Visit: Payer: Self-pay | Admitting: Family Medicine

## 2024-04-26 ENCOUNTER — Other Ambulatory Visit: Payer: Self-pay | Admitting: Family Medicine

## 2024-05-18 DIAGNOSIS — L2089 Other atopic dermatitis: Secondary | ICD-10-CM | POA: Diagnosis not present

## 2024-05-23 ENCOUNTER — Ambulatory Visit (INDEPENDENT_AMBULATORY_CARE_PROVIDER_SITE_OTHER): Admitting: Family Medicine

## 2024-05-23 ENCOUNTER — Ambulatory Visit: Payer: Self-pay | Admitting: Family Medicine

## 2024-05-23 VITALS — BP 128/86 | HR 61 | Temp 97.5°F | Wt 165.4 lb

## 2024-05-23 DIAGNOSIS — R599 Enlarged lymph nodes, unspecified: Secondary | ICD-10-CM | POA: Diagnosis not present

## 2024-05-23 DIAGNOSIS — R1909 Other intra-abdominal and pelvic swelling, mass and lump: Secondary | ICD-10-CM | POA: Diagnosis not present

## 2024-05-23 LAB — BASIC METABOLIC PANEL WITH GFR
BUN: 13 mg/dL (ref 6–23)
CO2: 32 meq/L (ref 19–32)
Calcium: 9.3 mg/dL (ref 8.4–10.5)
Chloride: 90 meq/L — ABNORMAL LOW (ref 96–112)
Creatinine, Ser: 0.89 mg/dL (ref 0.40–1.50)
GFR: 89.1 mL/min
Glucose, Bld: 64 mg/dL — ABNORMAL LOW (ref 70–99)
Potassium: 4 meq/L (ref 3.5–5.1)
Sodium: 128 meq/L — ABNORMAL LOW (ref 135–145)

## 2024-05-23 LAB — CBC WITH DIFFERENTIAL/PLATELET
Basophils Absolute: 0 K/uL (ref 0.0–0.1)
Basophils Relative: 0.7 % (ref 0.0–3.0)
Eosinophils Absolute: 0.1 K/uL (ref 0.0–0.7)
Eosinophils Relative: 2.6 % (ref 0.0–5.0)
HCT: 41 % (ref 39.0–52.0)
Hemoglobin: 14.3 g/dL (ref 13.0–17.0)
Lymphocytes Relative: 27.9 % (ref 12.0–46.0)
Lymphs Abs: 1.2 K/uL (ref 0.7–4.0)
MCHC: 34.8 g/dL (ref 30.0–36.0)
MCV: 95.1 fl (ref 78.0–100.0)
Monocytes Absolute: 0.5 K/uL (ref 0.1–1.0)
Monocytes Relative: 10.9 % (ref 3.0–12.0)
Neutro Abs: 2.5 K/uL (ref 1.4–7.7)
Neutrophils Relative %: 57.9 % (ref 43.0–77.0)
Platelets: 276 K/uL (ref 150.0–400.0)
RBC: 4.32 Mil/uL (ref 4.22–5.81)
RDW: 13.1 % (ref 11.5–15.5)
WBC: 4.4 K/uL (ref 4.0–10.5)

## 2024-05-23 NOTE — Progress Notes (Signed)
   Subjective:    Patient ID: [name], male    DOB: 1956/05/31, 68 y.o.   MRN: [id number]  HPI Here for a lump in the right groin that he first detected 4 days ago. This is not painful. No recent trauma. No change with urination or BM's.    Review of Systems  Constitutional: Negative.   Respiratory: Negative.    Cardiovascular: Negative.   Gastrointestinal:  Negative for abdominal distention, abdominal pain, blood in stool, constipation, diarrhea, nausea and vomiting.  Genitourinary: Negative.        Objective:   Physical Exam Constitutional:      Appearance: Normal appearance.   Cardiovascular:     Rate and Rhythm: Normal rate and regular rhythm.     Pulses: Normal pulses.     Heart sounds: Normal heart sounds.  Pulmonary:     Effort: Pulmonary effort is normal.     Breath sounds: Normal breath sounds.  Abdominal:     General: Abdomen is flat. There is no distension.     Palpations: Abdomen is soft.     Tenderness: There is no abdominal tenderness. There is no right CVA tenderness, left CVA tenderness, guarding or rebound.     Comments: There is a firm mobile non-tender mass in the right groin area. The scrotum is normal.    Neurological:     Mental Status: He is alert.           Assessment & Plan:  Right groin mass. This could be a hernia, but it is more likely an enlarged lymph node. We will set up a CT of the abdomen and pelvis to evaluate further.  Garnette Olmsted, MD

## 2024-05-29 ENCOUNTER — Ambulatory Visit: Payer: Medicare Other | Admitting: Family Medicine

## 2024-05-29 ENCOUNTER — Encounter: Payer: Self-pay | Admitting: Family Medicine

## 2024-05-29 ENCOUNTER — Ambulatory Visit (INDEPENDENT_AMBULATORY_CARE_PROVIDER_SITE_OTHER): Admitting: Family Medicine

## 2024-05-29 VITALS — BP 134/80 | HR 59 | Temp 97.9°F | Ht 67.0 in | Wt 164.0 lb

## 2024-05-29 DIAGNOSIS — I10 Essential (primary) hypertension: Secondary | ICD-10-CM

## 2024-05-29 DIAGNOSIS — M25471 Effusion, right ankle: Secondary | ICD-10-CM

## 2024-05-29 DIAGNOSIS — E871 Hypo-osmolality and hyponatremia: Secondary | ICD-10-CM

## 2024-05-29 DIAGNOSIS — M25472 Effusion, left ankle: Secondary | ICD-10-CM | POA: Diagnosis not present

## 2024-05-29 NOTE — Progress Notes (Signed)
   Subjective:    Patient ID: [name], male    DOB: 01-03-1956, 68 y.o.   MRN: [id number]  HPI Here to follow up on HTN and hyponatremia and ankle edema. He feels fine, and his ankles have not been swelling at all. We saw him recently for a mass in the right groin, and he is scheduled for a CT later this week to evaluate this. His sodium alst week was stable at 128.    Review of Systems  Constitutional: Negative.   Respiratory: Negative.    Cardiovascular: Negative.        Objective:   Physical Exam Constitutional:      Appearance: Normal appearance.  Cardiovascular:     Rate and Rhythm: Normal rate and regular rhythm.     Pulses: Normal pulses.     Heart sounds: Normal heart sounds.  Pulmonary:     Effort: Pulmonary effort is normal.     Breath sounds: Normal breath sounds.  Musculoskeletal:     Right lower leg: No edema.     Left lower leg: No edema.  Neurological:     Mental Status: He is alert.           Assessment & Plan:  His HTN and hyponatremia are stable. He has no ankle edema at this time. He will get the CT as above.  Garnette Olmsted, MD

## 2024-06-01 ENCOUNTER — Ambulatory Visit
Admission: RE | Admit: 2024-06-01 | Discharge: 2024-06-01 | Disposition: A | Discharge status: Home / Self Care | Source: Ambulatory Visit | Attending: Family Medicine | Admitting: Family Medicine

## 2024-06-01 DIAGNOSIS — R1909 Other intra-abdominal and pelvic swelling, mass and lump: Secondary | ICD-10-CM | POA: Diagnosis not present

## 2024-06-01 MED ORDER — IOPAMIDOL (ISOVUE-370) INJECTION 76%
75.0000 mL | Freq: Once | INTRAVENOUS | Status: AC | PRN
  Administered 2024-06-01: 75 mL via INTRAVENOUS

## 2024-08-14 ENCOUNTER — Ambulatory Visit: Payer: Self-pay | Admitting: Family Medicine

## 2024-08-14 ENCOUNTER — Encounter: Payer: Self-pay | Admitting: Family Medicine

## 2024-08-14 ENCOUNTER — Ambulatory Visit (INDEPENDENT_AMBULATORY_CARE_PROVIDER_SITE_OTHER): Admitting: Family Medicine

## 2024-08-14 VITALS — BP 120/80 | HR 60 | Temp 97.8°F | Wt 165.4 lb

## 2024-08-14 DIAGNOSIS — I10 Essential (primary) hypertension: Secondary | ICD-10-CM | POA: Diagnosis not present

## 2024-08-14 DIAGNOSIS — E538 Deficiency of other specified B group vitamins: Secondary | ICD-10-CM | POA: Diagnosis not present

## 2024-08-14 DIAGNOSIS — R29898 Other symptoms and signs involving the musculoskeletal system: Secondary | ICD-10-CM | POA: Diagnosis not present

## 2024-08-14 LAB — CBC WITH DIFFERENTIAL/PLATELET
Basophils Absolute: 0 K/uL (ref 0.0–0.1)
Basophils Relative: 0.6 % (ref 0.0–3.0)
Eosinophils Absolute: 0.1 K/uL (ref 0.0–0.7)
Eosinophils Relative: 3.1 % (ref 0.0–5.0)
HCT: 42.4 % (ref 39.0–52.0)
Hemoglobin: 14.5 g/dL (ref 13.0–17.0)
Lymphocytes Relative: 24.5 % (ref 12.0–46.0)
Lymphs Abs: 1.1 K/uL (ref 0.7–4.0)
MCHC: 34.3 g/dL (ref 30.0–36.0)
MCV: 96.8 fl (ref 78.0–100.0)
Monocytes Absolute: 0.5 K/uL (ref 0.1–1.0)
Monocytes Relative: 11.9 % (ref 3.0–12.0)
Neutro Abs: 2.7 K/uL (ref 1.4–7.7)
Neutrophils Relative %: 59.9 % (ref 43.0–77.0)
Platelets: 271 K/uL (ref 150.0–400.0)
RBC: 4.38 Mil/uL (ref 4.22–5.81)
RDW: 13.9 % (ref 11.5–15.5)
WBC: 4.5 K/uL (ref 4.0–10.5)

## 2024-08-14 LAB — TSH: TSH: 0.53 u[IU]/mL (ref 0.35–5.50)

## 2024-08-14 LAB — VITAMIN B12: Vitamin B-12: >1500 pg/mL — ABNORMAL HIGH (ref 211–911)

## 2024-08-14 NOTE — Progress Notes (Signed)
   Subjective:    Patient ID: [name], male    DOB: 1955-12-08, 68 y.o.   MRN: [id number]  HPI Here asking for some lab work because he is experiencing weakness in both legs again. He discussed this with us  about 4 years ago, and we found his B12 level to be low. He had weekly shots for 12 weeks, and we were able to get the level up to normal and he felt better. Since then he has been taking an OTC B12 pill every day. Now for the past few weeks the leg weakness has returned. In addition he has been taking an OTC supplement daily called ProvexCV, and he is convinced it has brought his BP down. In the past week he has had readings of 80's to 100's systolic. He thinks he is now on too much medication.    Review of Systems  Constitutional: Negative.   Respiratory: Negative.    Cardiovascular: Negative.   Neurological:  Positive for weakness. Negative for numbness.       Objective:   Physical Exam Constitutional:      Appearance: Normal appearance.  Cardiovascular:     Rate and Rhythm: Normal rate and regular rhythm.     Pulses: Normal pulses.     Heart sounds: Normal heart sounds.  Pulmonary:     Effort: Pulmonary effort is normal.     Breath sounds: Normal breath sounds.  Neurological:     Mental Status: He is alert.     Sensory: No sensory deficit.     Motor: No weakness.     Gait: Gait normal.           Assessment & Plan:  For the hx of B12 deficiency, we will check a level today. This could explain the leg weakness. For the HTN, we agreed for him to stop the Clonidine . He will report back in 3-4 weeks about his BP readings.  Garnette Olmsted, MD

## 2024-08-16 NOTE — Progress Notes (Signed)
 Spoke with patient regarding results and Dr. Mira recommendations. Questions answered. Patient voiced understanding.

## 2024-08-18 ENCOUNTER — Other Ambulatory Visit: Payer: Self-pay

## 2024-08-18 ENCOUNTER — Ambulatory Visit: Payer: Self-pay

## 2024-08-18 MED ORDER — CLONIDINE HCL 0.1 MG PO TABS: 0.1000 mg | ORAL_TABLET | Freq: Two times a day (BID) | ORAL | 0 refills | Status: DC

## 2024-08-18 NOTE — Telephone Encounter (Signed)
 FYI Spoke with pt advised of Cory recommendation, Rx for clonidine  sent  to the pharmacy, pt state that he will call to schedule a 2 weeks f/u with Dr Johnny

## 2024-08-18 NOTE — Telephone Encounter (Signed)
 FYI Only or Action Required?: Action required by provider: clinical question for provider.  Patient was last seen in primary care on 08/14/2024 by Johnny Garnette LABOR, MD.  Called Nurse Triage reporting Hypertension.  Symptoms began today.  Interventions attempted: Nothing.  Symptoms are: unchanged.  Triage Disposition: See PCP Within 2 Weeks  Patient/caregiver understands and will follow disposition?: No, wishes to speak with PCP (Patient 9/22)       Copied from CRM #[id number]. Topic: Clinical - Red Word Triage >> Aug 18, 2024 11:10 AM Larissa RAMAN wrote: [facility] that prompted transfer to Nurse Triage: Bp elevated since medication change. Bp is 160/92.        Reason for Disposition  [1] Systolic BP >= 130 OR Diastolic >= 80 AND [2] taking BP medications  Answer Assessment - Initial Assessment Questions Patient seen on 9/22 and was taken off of his Clonidine  due to low blood pressures. Patient's blood pressures are now elevated and he would like to know what Dr. Johnny would recommend. Please advise.     1. BLOOD PRESSURE: What is your blood pressure? Did you take at least two measurements 5 minutes apart?     145/92 2. ONSET: When did you take your blood pressure?     Today  3. HOW: How did you take your blood pressure? (e.g., automatic home BP monitor, visiting nurse)     Automatic BP cuff  4. HISTORY: Do you have a history of high blood pressure?     Yes 5. MEDICINES: Are you taking any medicines for blood pressure? Have you missed any doses recently?     Had his medication changed on 9/22 6. OTHER SYMPTOMS: Do you have any symptoms? (e.g., blurred vision, chest pain, difficulty breathing, headache, weakness)     No  Protocols used: Blood Pressure - High-A-AH

## 2024-08-29 ENCOUNTER — Ambulatory Visit: Admitting: Family Medicine

## 2024-09-01 ENCOUNTER — Ambulatory Visit: Admitting: Family Medicine

## 2024-09-01 ENCOUNTER — Encounter: Payer: Self-pay | Admitting: Family Medicine

## 2024-09-01 VITALS — BP 136/80 | HR 61 | Temp 97.8°F | Wt 164.5 lb

## 2024-09-01 DIAGNOSIS — E538 Deficiency of other specified B group vitamins: Secondary | ICD-10-CM

## 2024-09-01 DIAGNOSIS — I10 Essential (primary) hypertension: Secondary | ICD-10-CM | POA: Diagnosis not present

## 2024-09-01 MED ORDER — CLONIDINE HCL 0.1 MG PO TABS: 0.1000 mg | ORAL_TABLET | Freq: Two times a day (BID) | ORAL | 3 refills | Status: AC

## 2024-09-01 MED ORDER — B-12 2500 MCG PO TABS: 1.0000 | ORAL_TABLET | ORAL | Status: AC

## 2024-09-01 NOTE — Progress Notes (Signed)
   Subjective:    Patient ID: [name], male    DOB: 1956-05-10, 68 y.o.   MRN: [id number]  HPI Here to follow up on HTN and B12 deficiency. At our last visit 2 weeks ago [name] was complaining of leg weakness and getting very low BP at home. We got labs that day, and his B12 came back as >1500. We decreased the Clonidine  to 0.1 mg BID, and we decreased the B12 tablets to every other day. Since then his BP has been stable with systolic readings from 116 to140. [name] has felt better with no more leg weakness.    Review of Systems  Constitutional: Negative.   Respiratory: Negative.    Cardiovascular: Negative.        Objective:   Physical Exam Constitutional:      Appearance: Normal appearance.  Cardiovascular:     Rate and Rhythm: Normal rate and regular rhythm.     Pulses: Normal pulses.     Heart sounds: Normal heart sounds.  Pulmonary:     Effort: Pulmonary effort is normal.     Breath sounds: Normal breath sounds.  Neurological:     Mental Status: [name] is alert.           Assessment & Plan:  His HTN is now well controlled. We will recheck BP and a B12 level at his physical in January. Garnette Olmsted, MD

## 2024-11-29 ENCOUNTER — Ambulatory Visit: Admitting: Family Medicine

## 2024-12-01 ENCOUNTER — Encounter: Admitting: Family Medicine

## 2024-12-06 ENCOUNTER — Ambulatory Visit (INDEPENDENT_AMBULATORY_CARE_PROVIDER_SITE_OTHER): Admitting: Family Medicine

## 2024-12-06 ENCOUNTER — Ambulatory Visit

## 2024-12-06 ENCOUNTER — Encounter: Payer: Self-pay | Admitting: Family Medicine

## 2024-12-06 VITALS — BP 130/80 | HR 80 | Temp 98.3°F | Ht 66.0 in | Wt 161.2 lb

## 2024-12-06 DIAGNOSIS — N401 Enlarged prostate with lower urinary tract symptoms: Secondary | ICD-10-CM | POA: Diagnosis not present

## 2024-12-06 DIAGNOSIS — R0602 Shortness of breath: Secondary | ICD-10-CM

## 2024-12-06 DIAGNOSIS — E538 Deficiency of other specified B group vitamins: Secondary | ICD-10-CM

## 2024-12-06 DIAGNOSIS — F17210 Nicotine dependence, cigarettes, uncomplicated: Secondary | ICD-10-CM | POA: Insufficient documentation

## 2024-12-06 DIAGNOSIS — M25472 Effusion, left ankle: Secondary | ICD-10-CM | POA: Diagnosis not present

## 2024-12-06 DIAGNOSIS — Z1211 Encounter for screening for malignant neoplasm of colon: Secondary | ICD-10-CM | POA: Diagnosis not present

## 2024-12-06 DIAGNOSIS — Z23 Encounter for immunization: Secondary | ICD-10-CM

## 2024-12-06 DIAGNOSIS — I10 Essential (primary) hypertension: Secondary | ICD-10-CM

## 2024-12-06 DIAGNOSIS — M25471 Effusion, right ankle: Secondary | ICD-10-CM

## 2024-12-06 DIAGNOSIS — R739 Hyperglycemia, unspecified: Secondary | ICD-10-CM | POA: Diagnosis not present

## 2024-12-06 DIAGNOSIS — K219 Gastro-esophageal reflux disease without esophagitis: Secondary | ICD-10-CM

## 2024-12-06 DIAGNOSIS — N138 Other obstructive and reflux uropathy: Secondary | ICD-10-CM

## 2024-12-06 LAB — HEPATIC FUNCTION PANEL
ALT: 15 U/L (ref 3–53)
AST: 15 U/L (ref 5–37)
Albumin: 4.1 g/dL (ref 3.5–5.2)
Alkaline Phosphatase: 88 U/L (ref 39–117)
Bilirubin, Direct: 0.2 mg/dL (ref 0.1–0.3)
Total Bilirubin: 0.8 mg/dL (ref 0.2–1.2)
Total Protein: 6.3 g/dL (ref 6.0–8.3)

## 2024-12-06 LAB — CBC WITH DIFFERENTIAL/PLATELET
Basophils Absolute: 0 K/uL (ref 0.0–0.1)
Basophils Relative: 0.8 % (ref 0.0–3.0)
Eosinophils Absolute: 0.1 K/uL (ref 0.0–0.7)
Eosinophils Relative: 2.8 % (ref 0.0–5.0)
HCT: 44.4 % (ref 39.0–52.0)
Hemoglobin: 15.3 g/dL (ref 13.0–17.0)
Lymphocytes Relative: 26.1 % (ref 12.0–46.0)
Lymphs Abs: 1.4 K/uL (ref 0.7–4.0)
MCHC: 34.4 g/dL (ref 30.0–36.0)
MCV: 95.8 fl (ref 78.0–100.0)
Monocytes Absolute: 0.5 K/uL (ref 0.1–1.0)
Monocytes Relative: 8.7 % (ref 3.0–12.0)
Neutro Abs: 3.3 K/uL (ref 1.4–7.7)
Neutrophils Relative %: 61.6 % (ref 43.0–77.0)
Platelets: 303 K/uL (ref 150.0–400.0)
RBC: 4.63 Mil/uL (ref 4.22–5.81)
RDW: 13.4 % (ref 11.5–15.5)
WBC: 5.4 K/uL (ref 4.0–10.5)

## 2024-12-06 LAB — BASIC METABOLIC PANEL WITH GFR
BUN: 10 mg/dL (ref 6–23)
CO2: 27 meq/L (ref 19–32)
Calcium: 9 mg/dL (ref 8.4–10.5)
Chloride: 95 meq/L — ABNORMAL LOW (ref 96–112)
Creatinine, Ser: 0.91 mg/dL (ref 0.40–1.50)
GFR: 87.29 mL/min
Glucose, Bld: 95 mg/dL (ref 70–99)
Potassium: 4.2 meq/L (ref 3.5–5.1)
Sodium: 129 meq/L — ABNORMAL LOW (ref 135–145)

## 2024-12-06 LAB — LIPID PANEL
Cholesterol: 160 mg/dL (ref 28–200)
HDL: 38.6 mg/dL — ABNORMAL LOW
LDL Cholesterol: 108 mg/dL — ABNORMAL HIGH (ref 10–99)
NonHDL: 121.58
Total CHOL/HDL Ratio: 4
Triglycerides: 68 mg/dL (ref 10.0–149.0)
VLDL: 13.6 mg/dL (ref 0.0–40.0)

## 2024-12-06 LAB — VITAMIN B12: Vitamin B-12: 1442 pg/mL — ABNORMAL HIGH (ref 211–911)

## 2024-12-06 LAB — TSH: TSH: 0.84 u[IU]/mL (ref 0.35–5.50)

## 2024-12-06 LAB — HEMOGLOBIN A1C: Hgb A1c MFr Bld: 5.7 % (ref 4.6–6.5)

## 2024-12-06 LAB — PSA: PSA: 0.94 ng/mL (ref 0.10–4.00)

## 2024-12-06 MED ORDER — LOSARTAN POTASSIUM-HCTZ 50-12.5 MG PO TABS: 1.0000 | ORAL_TABLET | Freq: Every day | ORAL | 3 refills | Status: AC

## 2024-12-06 MED ORDER — METOPROLOL SUCCINATE ER 100 MG PO TB24: 100.0000 mg | ORAL_TABLET | Freq: Every day | ORAL | 3 refills | Status: AC

## 2024-12-06 MED ORDER — AMLODIPINE BESYLATE 10 MG PO TABS: 10.0000 mg | ORAL_TABLET | Freq: Every day | ORAL | 3 refills | Status: AC

## 2024-12-06 NOTE — Progress Notes (Signed)
 "  Subjective:    Patient ID: [name], male    DOB: [birth year], 69 y.o.   MRN: [id number]  HPI Here to follow up on issues. His BP has been stable. He has noticed some SOB at times. No coughing or chest pain. He still smokes 1/2 ppd of cigarettes. His GERD is stable. His ankles have not swollen for a good while. His BPH is stable.    Review of Systems  Constitutional: Negative.   HENT: Negative.    Eyes: Negative.   Respiratory:  Positive for shortness of breath.   Cardiovascular: Negative.   Gastrointestinal: Negative.   Genitourinary: Negative.   Musculoskeletal: Negative.   Skin: Negative.   Neurological: Negative.   Psychiatric/Behavioral: Negative.         Objective:   Physical Exam Constitutional:      General: He is not in acute distress.    Appearance: Normal appearance. He is well-developed. He is not diaphoretic.  HENT:     Head: Normocephalic and atraumatic.     Right Ear: External ear normal.     Left Ear: External ear normal.     Nose: Nose normal.     Mouth/Throat:     Pharynx: No oropharyngeal exudate.  Eyes:     General: No scleral icterus.       Right eye: No discharge.        Left eye: No discharge.     Conjunctiva/sclera: Conjunctivae normal.     Pupils: Pupils are equal, round, and reactive to light.  Neck:     Thyroid : No thyromegaly.     Vascular: No JVD.     Trachea: No tracheal deviation.  Cardiovascular:     Rate and Rhythm: Normal rate and regular rhythm.     Pulses: Normal pulses.     Heart sounds: Normal heart sounds. No murmur heard.    No friction rub. No gallop.  Pulmonary:     Effort: Pulmonary effort is normal. No respiratory distress.     Breath sounds: No rales.     Comments: There are wheezes at the left lung base  Chest:     Chest wall: No tenderness.  Abdominal:     General: Bowel sounds are normal. There is no distension.     Palpations: Abdomen is soft. There is no mass.     Tenderness: There is no abdominal  tenderness. There is no guarding or rebound.  Genitourinary:    Penis: Normal. No tenderness.      Testes: Normal.  Musculoskeletal:        General: No tenderness. Normal range of motion.     Cervical back: Neck supple.     Right lower leg: No edema.     Left lower leg: No edema.  Lymphadenopathy:     Cervical: No cervical adenopathy.  Skin:    General: Skin is warm and dry.     Coloration: Skin is not pale.     Findings: No erythema or rash.  Neurological:     General: No focal deficit present.     Mental Status: He is alert and oriented to person, place, and time.     Cranial Nerves: No cranial nerve deficit.     Motor: No abnormal muscle tone.     Coordination: Coordination normal.     Deep Tendon Reflexes: Reflexes are normal and symmetric. Reflexes normal.  Psychiatric:        Mood and Affect: Mood normal.  Behavior: Behavior normal.        Thought Content: Thought content normal.        Judgment: Judgment normal.           Assessment & Plan:  His HTN is well controlled. He has some SOB, so we will get a CXR today. He still smokes, so I again urged him to quit smoking. He says he will start wearing some OTC nicotine  patches. His BPH is stable. His GERD is stable. His ankle edema is stable. We will get labs to check lipids, A1c, B12 , etc. He is due for another colonoscopy.  I personally spent a total of 35 minutes in the care of the patient today including getting/reviewing separately obtained history, performing a medically appropriate exam/evaluation, counseling and educating, and placing orders.  Garnette Olmsted, MD   "

## 2024-12-06 NOTE — Addendum Note (Signed)
 Addended by: LADONNA INOCENTE SAILOR on: 12/06/2024 11:21 AM   Modules accepted: Orders

## 2024-12-07 ENCOUNTER — Ambulatory Visit: Payer: Self-pay | Admitting: Family Medicine

## 2025-06-05 ENCOUNTER — Ambulatory Visit: Admitting: Family Medicine
